# Patient Record
Sex: Female | Born: 1955
Health system: Southern US, Community
[De-identification: ages and names within clinical notes are randomized; demographics above are authoritative.]

## PROBLEM LIST (undated history)

## (undated) DIAGNOSIS — I10 Essential (primary) hypertension: Secondary | ICD-10-CM

## (undated) DIAGNOSIS — E785 Hyperlipidemia, unspecified: Secondary | ICD-10-CM

## (undated) DIAGNOSIS — K589 Irritable bowel syndrome without diarrhea: Secondary | ICD-10-CM

## (undated) DIAGNOSIS — G43109 Migraine with aura, not intractable, without status migrainosus: Secondary | ICD-10-CM

## (undated) DIAGNOSIS — M069 Rheumatoid arthritis, unspecified: Secondary | ICD-10-CM

## (undated) HISTORY — PX: OTHER SURGICAL HISTORY: SHX169

## (undated) HISTORY — DX: Essential (primary) hypertension: I10

## (undated) HISTORY — DX: Rheumatoid arthritis, unspecified: M06.9

## (undated) HISTORY — DX: Hyperlipidemia, unspecified: E78.5

## (undated) HISTORY — PX: TUBAL LIGATION: SHX77

## (undated) HISTORY — DX: Migraine with aura, not intractable, without status migrainosus: G43.109

## (undated) HISTORY — DX: Irritable bowel syndrome, unspecified: K58.9

---

## 2005-03-26 DIAGNOSIS — M069 Rheumatoid arthritis, unspecified: Secondary | ICD-10-CM

## 2005-03-26 HISTORY — DX: Rheumatoid arthritis, unspecified: M06.9

## 2012-06-30 ENCOUNTER — Encounter (HOSPITAL_COMMUNITY): Payer: Self-pay | Admitting: *Deleted

## 2012-06-30 ENCOUNTER — Emergency Department (HOSPITAL_COMMUNITY)
Admission: EM | Admit: 2012-06-30 | Discharge: 2012-06-30 | Disposition: A | Discharge status: Home / Self Care | Payer: No Typology Code available for payment source | Source: Home / Self Care | Attending: Family Medicine | Admitting: Family Medicine

## 2012-06-30 DIAGNOSIS — M069 Rheumatoid arthritis, unspecified: Secondary | ICD-10-CM

## 2012-06-30 LAB — COMPREHENSIVE METABOLIC PANEL
ALT: 11 U/L (ref 0–35)
AST: 15 U/L (ref 0–37)
Albumin: 3.5 g/dL (ref 3.5–5.2)
Alkaline Phosphatase: 91 U/L (ref 39–117)
Glucose, Bld: 86 mg/dL (ref 70–99)
Potassium: 4.2 mEq/L (ref 3.5–5.1)
Sodium: 139 mEq/L (ref 135–145)
Total Protein: 7.5 g/dL (ref 6.0–8.3)

## 2012-06-30 LAB — CBC
Hemoglobin: 13.1 g/dL (ref 12.0–15.0)
MCHC: 34.7 g/dL (ref 30.0–36.0)
Platelets: 313 10*3/uL (ref 150–400)

## 2012-06-30 MED ORDER — OMEPRAZOLE 20 MG PO CPDR: 20.0000 mg | DELAYED_RELEASE_CAPSULE | Freq: Every day | ORAL | Status: DC

## 2012-06-30 NOTE — ED Notes (Signed)
Patient present with swelling, pain, and stiffness In arms bilaterally, hands and left knee

## 2012-06-30 NOTE — ED Provider Notes (Signed)
History     CSN: [id number]  Arrival date & time 06/30/12  1730   First MD Initiated Contact with Patient 06/30/12 1814      No chief complaint on file.   (Consider location/radiation/quality/duration/timing/severity/associated sxs/prior treatment) HPI Pt says that she has been out of her medications for RA for last 4 or 5 months.  Pt says that she has a complicated medical history .  Pt has relocated here from [place].  Pt is having swelling and pain in hands and stiffness in knees and joints,  Worse joints involve her left knee.  No fever or chills.   Past Medical History  Diagnosis Date  . Arthritis     No past surgical history on file.  No family history on file.  History  Substance Use Topics  . Smoking status: Not on file  . Smokeless tobacco: Not on file  . Alcohol Use: Not on file    OB History   Grav Para Term Preterm Abortions TAB SAB Ect Mult Living                  Review of Systems Constitutional: Negative.  HENT: Negative.  Respiratory: Negative.  Cardiovascular: Negative.  Gastrointestinal: Negative.  Endocrine: Negative.  Genitourinary: Negative.  Musculoskeletal: Arthritis  Skin: Negative.  Allergic/Immunologic: Negative.  Neurological: Negative.  Hematological: Negative.  Psychiatric/Behavioral: Negative.  All other systems reviewed and are negative   Allergies  Review of patient's allergies indicates not on file.  Home Medications  No current outpatient prescriptions on file.  BP 122/75  Pulse 74  Temp(Src) 97.2 F (36.2 C) (Oral)  Resp 18  SpO2 99%  Physical Exam Nursing note and vitals reviewed.  Constitutional: She is oriented to person, place, and time. She appears well-developed and well-nourished. No distress.  HENT:  Head: Normocephalic and atraumatic.  Eyes: Conjunctivae and EOM are normal. Pupils are equal, round, and reactive to light.  Neck: Normal range of motion. Neck supple. No JVD present. No tracheal  deviation present. No thyromegaly present.  Cardiovascular: Normal rate, regular rhythm and normal heart sounds.  Pulmonary/Chest: Effort normal and breath sounds normal. No respiratory distress. She has no wheezes.  Abdominal: Soft. Bowel sounds are normal.  Musculoskeletal: Normal range of motion. She exhibits PIP and metatarsal edema and tenderness.  Lymphadenopathy:  She has no cervical adenopathy.  Neurological: She is alert and oriented to person, place, and time. She has normal reflexes.  Skin: Skin is warm and dry.  Psychiatric: She has a normal mood and affect. Her behavior is normal. Judgment and thought content normal.    ED Course  Procedures (including critical care time)  Labs Reviewed - No data to display No results found.  No diagnosis found.   MDM  IMPRESSION  Rheumatoid Arthritis  RECOMMENDATIONS / PLAN Continue celebrex 200 mg po daily Omeprazole 20 mg po daily for GI protection Referral to rheumatology for further evaluation and management DC advil and use tylenol instead  FOLLOW UP 1 month for recheck  Check labs today  The patient was given clear instructions to go to ER or return to medical center if symptoms don't improve, worsen or new problems develop.  The patient verbalized understanding.  The patient was told to call to get lab results if they haven't heard anything in the next week.            Cleora Fleet, MD 06/30/12 [phone]

## 2012-07-01 ENCOUNTER — Telehealth (HOSPITAL_COMMUNITY): Payer: Self-pay

## 2012-07-01 NOTE — ED Notes (Signed)
Called patient again. Lab results given

## 2012-07-01 NOTE — Progress Notes (Signed)
Quick Note:  Please inform patient that her labs came back OK except her sed rate was elevated which is consistent for inflammation that goes with the rheumatoid arthritis.   Rodney Langton, MD, CDE, FAAFP [facility] [place], [place]   ______

## 2012-07-02 NOTE — ED Notes (Signed)
Patient has an appt with rheumatology 5/20 @ 10:30 am

## 2012-10-14 ENCOUNTER — Telehealth: Payer: Self-pay | Admitting: Family Medicine

## 2012-10-14 DIAGNOSIS — Z0100 Encounter for examination of eyes and vision without abnormal findings: Secondary | ICD-10-CM

## 2012-10-14 NOTE — Telephone Encounter (Signed)
Pt. Needs an Opthalmology referral, pt has been having floaters in her eyes. Her arthritis doctor recommended that she goes to the Opthalmologist.

## 2012-12-11 ENCOUNTER — Ambulatory Visit: Payer: No Typology Code available for payment source | Attending: Internal Medicine

## 2012-12-24 ENCOUNTER — Ambulatory Visit: Payer: Self-pay

## 2013-01-01 ENCOUNTER — Ambulatory Visit: Payer: Self-pay

## 2013-01-09 ENCOUNTER — Ambulatory Visit: Payer: No Typology Code available for payment source | Attending: Internal Medicine | Admitting: Internal Medicine

## 2013-01-09 VITALS — BP 107/70 | HR 69 | Temp 97.9°F | Resp 16 | Wt 148.0 lb

## 2013-01-09 DIAGNOSIS — E785 Hyperlipidemia, unspecified: Secondary | ICD-10-CM | POA: Insufficient documentation

## 2013-01-09 DIAGNOSIS — Z23 Encounter for immunization: Secondary | ICD-10-CM | POA: Insufficient documentation

## 2013-01-09 DIAGNOSIS — M069 Rheumatoid arthritis, unspecified: Secondary | ICD-10-CM | POA: Insufficient documentation

## 2013-01-09 DIAGNOSIS — Z0000 Encounter for general adult medical examination without abnormal findings: Secondary | ICD-10-CM

## 2013-01-09 MED ORDER — ROSUVASTATIN CALCIUM 5 MG PO TABS: 5.0000 mg | ORAL_TABLET | Freq: Every day | ORAL | Status: DC

## 2013-01-09 NOTE — Progress Notes (Signed)
Patient ID: Alice Graham, female   DOB: Aug 12, 1955, 57 y.o.   MRN: [id number]   HPI 57 year old female with history of rheumatoid arthritis follows with a Rheumatologist at [facility] ( Dr Lanell Matar) here medication regarding her elevated cholesterol. Patient had lipid panel checked in July which showed total cholesterol 275 with elevated Lille of 188 and triglycerides of 180s. ( done at [facility]) Patient reports that her MAP program will cover for crestor. He reports having recurrent UTI after being on methotrexate.   Vital signs in last 24 hours:  Filed Vitals:   01/09/13 1221  BP: 107/70  Pulse: 69  Temp: 97.9 F (36.6 C)  TempSrc: Oral  Resp: 16  Weight: 148 lb (67.132 kg)  SpO2: 97%      Physical Exam:  General: Middle aged female in no acute distress HEENT: no pallor, no icterus, moist oral mucosa,  Heart: Normal  s1 &s2  Regular rate and rhythm, without murmurs, rubs, gallops. Lungs: Clear to auscultation bilaterally. Abdomen: Soft, nontender, nondistended, positive bowel sounds. Extremities: Warm, no edema Neuro: Alert, awake, oriented x3, nonfocal.   Lab Results:  Basic Metabolic Panel:    Component Value Date/Time   NA 139 06/30/2012 1830   K 4.2 06/30/2012 1830   CL 104 06/30/2012 1830   CO2 27 06/30/2012 1830   BUN 10 06/30/2012 1830   CREATININE 0.51 06/30/2012 1830   GLUCOSE 86 06/30/2012 1830   CALCIUM 9.2 06/30/2012 1830   CBC:    Component Value Date/Time   WBC 7.2 06/30/2012 1830   HGB 13.1 06/30/2012 1830   HCT 37.8 06/30/2012 1830   PLT 313 06/30/2012 1830   MCV 89.2 06/30/2012 1830    No results found for this or any previous visit (from the past 240 hour(s)).  Studies/Results: No results found.  Medications: Reviewed  Assessment/Plan: Hyperlipidemia We'll start her on low-dose Crestor 5 mg daily.  Rheumatoid arthritis Follows with Dr. Lanell Matar at [facility]  Refer to gyn for  PAP and mamogram  Flu vaccine to be given today  Follow up in 6 months with  repeat lipid panel  Harout Scheurich 01/09/2013, 12:49 PM

## 2013-01-09 NOTE — Progress Notes (Signed)
Pt is here to have her meds refilled and needing flu vaccination Voices no new concerns. Alert w/no signs of acute distress.

## 2013-03-23 ENCOUNTER — Encounter: Payer: No Typology Code available for payment source | Admitting: Obstetrics & Gynecology

## 2013-04-13 ENCOUNTER — Ambulatory Visit: Payer: Self-pay

## 2013-04-29 ENCOUNTER — Encounter: Payer: Self-pay | Admitting: Obstetrics & Gynecology

## 2013-05-06 ENCOUNTER — Encounter: Payer: Self-pay | Admitting: Obstetrics & Gynecology

## 2014-10-13 ENCOUNTER — Ambulatory Visit (INDEPENDENT_AMBULATORY_CARE_PROVIDER_SITE_OTHER): Payer: 59 | Admitting: Physician Assistant

## 2014-10-13 VITALS — BP 138/88 | HR 83 | Temp 98.2°F | Resp 16 | Ht 64.5 in | Wt 150.4 lb

## 2014-10-13 DIAGNOSIS — Z1211 Encounter for screening for malignant neoplasm of colon: Secondary | ICD-10-CM | POA: Diagnosis not present

## 2014-10-13 DIAGNOSIS — R030 Elevated blood-pressure reading, without diagnosis of hypertension: Secondary | ICD-10-CM | POA: Diagnosis not present

## 2014-10-13 DIAGNOSIS — Z124 Encounter for screening for malignant neoplasm of cervix: Secondary | ICD-10-CM | POA: Diagnosis not present

## 2014-10-13 DIAGNOSIS — E785 Hyperlipidemia, unspecified: Secondary | ICD-10-CM | POA: Diagnosis not present

## 2014-10-13 DIAGNOSIS — IMO0001 Reserved for inherently not codable concepts without codable children: Secondary | ICD-10-CM

## 2014-10-13 DIAGNOSIS — Z833 Family history of diabetes mellitus: Secondary | ICD-10-CM | POA: Diagnosis not present

## 2014-10-13 DIAGNOSIS — M069 Rheumatoid arthritis, unspecified: Secondary | ICD-10-CM | POA: Diagnosis not present

## 2014-10-13 DIAGNOSIS — Z1239 Encounter for other screening for malignant neoplasm of breast: Secondary | ICD-10-CM | POA: Diagnosis not present

## 2014-10-13 DIAGNOSIS — I10 Essential (primary) hypertension: Secondary | ICD-10-CM | POA: Insufficient documentation

## 2014-10-13 MED ORDER — ATORVASTATIN CALCIUM 20 MG PO TABS: 20.0000 mg | ORAL_TABLET | Freq: Every day | ORAL | Status: DC

## 2014-10-13 MED ORDER — LISINOPRIL 10 MG PO TABS: 10.0000 mg | ORAL_TABLET | Freq: Every day | ORAL | Status: DC

## 2014-10-13 NOTE — Progress Notes (Signed)
[facility] [address] [place] [address] [phone]  Date:  10/13/2014   Name:  Alice Graham   DOB:  06/25/1955   MRN:  [id number]  PCP:  Angelica Chessman, MD    Chief Complaint: referral; blood preesure; and OTHER   History of Present Illness:  This is a 59 y.o. female with PMH rheumatoid arthritis and HLD who is presenting to discuss high blood pressure and needing referral to rheumatology.   RA: Dx'd 2007. Seeing Dr. Lorenza Cambridge at [facility]. She is taking methotrexate and extra strength tylenol and controls her symptoms pretty well. RA has affected bilateral wrists and all fingers on bilateral hands. Insurance is requiring her to get a referral to rheumatology.  High blood pressure: checking at home for the past 2 months and usually 125-145/90-93. She is worried about her diastolic number. States she has a strong family history of DM, HTN and CAD. Her father died of a stroke at age 28. Her mother died of an MI at age 17. She has not had blood work since 2012 in [place]. In 2012 she moved her to be closer to family but has had problems with finances and has not seen a PCP since moving here.  HLD: was on lipitor 20 mg. Ran out 2 years ago and hasn't been on anything since.  Works as an Therapist, sports in [place].   Denies tobacco use  Last pap 2012. Mammogram 2012. Colonoscopy 2006. Father had history of colon cancer.  Review of Systems:  Review of Systems See HPI  Patient Active Problem List   Diagnosis Date Noted  . Elevated BP 10/13/2014  . Rheumatoid arthritis 01/09/2013  . Hyperlipidemia 01/09/2013    Prior to Admission medications   Medication Sig Start Date End Date Taking? Authorizing Provider  Acetaminophen (TYLENOL EXTRA STRENGTH PO) Take by mouth.   Yes Historical Provider, MD  aspirin 81 MG tablet Take 81 mg by mouth daily.   Yes Historical Provider, MD  celecoxib (CELEBREX) 200 MG capsule Take 200 mg by mouth 2  (two) times daily.   Yes Historical Provider, MD  folic acid (FOLVITE) 1 MG tablet Take 1 mg by mouth daily.   Yes Historical Provider, MD  methotrexate (RHEUMATREX) 2.5 MG tablet Take by mouth once a week. Caution:Chemotherapy. Protect from light.   Yes Historical Provider, MD  omeprazole (PRILOSEC) 20 MG capsule Take 1 capsule (20 mg total) by mouth daily. 06/30/12  Yes Clanford Marisa Hua, MD                  No Known Allergies  Past Surgical History  Procedure Laterality Date  . Childbirth    . Tubal ligation      History  Substance Use Topics  . Smoking status: Former Research scientist (life sciences)  . Smokeless tobacco: Not on file  . Alcohol Use: Yes     Comment: very rare use     Family History  Problem Relation Age of Onset  . Heart failure    . Stroke    . Diabetes    . COPD    . Diabetes Mother   . Heart disease Mother   . Cancer Father   . Heart disease Father   . Hyperlipidemia Father   . Hypertension Father   . Stroke Father     Medication list has been reviewed and updated.  Physical Examination:  Physical Exam  Constitutional: She is oriented to person, place, and time.  She appears well-developed and well-nourished. No distress.  Smells like tobacco  HENT:  Head: Normocephalic and atraumatic.  Right Ear: Hearing normal.  Left Ear: Hearing normal.  Nose: Nose normal.  Eyes: Conjunctivae and lids are normal. Right eye exhibits no discharge. Left eye exhibits no discharge. No scleral icterus.  Neck: Trachea normal. Carotid bruit is not present.  Cardiovascular: Normal rate, regular rhythm, normal heart sounds and normal pulses.   No murmur heard. Pulmonary/Chest: Effort normal and breath sounds normal. No respiratory distress. She has no wheezes. She has no rhonchi. She has no rales.  Musculoskeletal:       Right wrist: She exhibits decreased range of motion and swelling.       Left wrist: She exhibits decreased range of motion and swelling.       Right hand: She exhibits  decreased range of motion.       Left hand: She exhibits decreased range of motion.  Unable to make fist with bilateral hands.  Neurological: She is alert and oriented to person, place, and time.  Skin: Skin is warm, dry and intact. No lesion and no rash noted.  Psychiatric: She has a normal mood and affect. Her speech is normal and behavior is normal. Thought content normal.   BP 138/88 mmHg  Pulse 83  Temp(Src) 98.2 F (36.8 C) (Oral)  Resp 16  Ht 5' 4.5" (1.638 m)  Wt 150 lb 6.4 oz (68.221 kg)  BMI 25.43 kg/m2  SpO2 97%  Assessment and Plan:  1. Elevated BP BP not very elevated but pt worried because of her family history of DM, CAD, and HTN. She would like to start something for blood pressure. Lisinopril 10 mg prescribed. She will continue to monitor BP at home and follow up in 3 months. - lisinopril (PRINIVIL,ZESTRIL) 10 MG tablet; Take 1 tablet (10 mg total) by mouth daily.  Dispense: 90 tablet; Refill: 1  2. Rheumatoid arthritis Referral placed. - Ambulatory referral to Rheumatology - CBC; Future  3. Hyperlipidemia Restart lipitor. She will return for fasting blood work. Follow up in 3 months. - atorvastatin (LIPITOR) 20 MG tablet; Take 1 tablet (20 mg total) by mouth daily.  Dispense: 90 tablet; Refill: 1 - Lipid panel; Future  4. Colon cancer screening - Ambulatory referral to Gastroenterology  5. Breast cancer screening - MM Digital Screening; Future  6. Cervical cancer screening Due for pap smear. At follow up in 3 months will do pap smear.  7. Family history of diabetes mellitus - Comprehensive metabolic panel; Future - Hemoglobin A1c; Future   Benjaman Pott. Drenda Freeze, MHS [facility]  10/13/2014

## 2014-10-13 NOTE — Patient Instructions (Signed)
Start taking lisinopril daily for blood pressure. Keep monitoring BP at home. Start taking lipitor daily. Return for fasting blood work. I will call you with results. You will get phone call about referrals to rheumatology, mammogram and colonoscopy. Return to see me in 3 months for follow up and repeat blood work - we will do pap smear at that time.

## 2014-10-14 ENCOUNTER — Other Ambulatory Visit: Payer: Self-pay

## 2014-10-14 ENCOUNTER — Other Ambulatory Visit (INDEPENDENT_AMBULATORY_CARE_PROVIDER_SITE_OTHER): Payer: 59

## 2014-10-14 DIAGNOSIS — M069 Rheumatoid arthritis, unspecified: Secondary | ICD-10-CM | POA: Diagnosis not present

## 2014-10-14 DIAGNOSIS — E785 Hyperlipidemia, unspecified: Secondary | ICD-10-CM

## 2014-10-14 DIAGNOSIS — Z1231 Encounter for screening mammogram for malignant neoplasm of breast: Secondary | ICD-10-CM

## 2014-10-14 DIAGNOSIS — Z833 Family history of diabetes mellitus: Secondary | ICD-10-CM

## 2014-10-15 ENCOUNTER — Other Ambulatory Visit: Payer: Self-pay

## 2014-10-15 DIAGNOSIS — Z1231 Encounter for screening mammogram for malignant neoplasm of breast: Secondary | ICD-10-CM

## 2014-10-15 LAB — CBC
HEMATOCRIT: 38.6 % (ref 34.0–46.6)
Hemoglobin: 13 g/dL (ref 11.1–15.9)
MCH: 29.3 pg (ref 26.6–33.0)
MCHC: 33.7 g/dL (ref 31.5–35.7)
MCV: 87 fL (ref 79–97)
Platelets: 370 10*3/uL (ref 150–379)
RBC: 4.43 x10E6/uL (ref 3.77–5.28)
RDW: 14.8 % (ref 12.3–15.4)
WBC: 7 10*3/uL (ref 3.4–10.8)

## 2014-10-15 LAB — COMPREHENSIVE METABOLIC PANEL
ALK PHOS: 84 IU/L (ref 39–117)
ALT: 11 IU/L (ref 0–32)
AST: 15 IU/L (ref 0–40)
Albumin/Globulin Ratio: 1.4 (ref 1.1–2.5)
Albumin: 4.1 g/dL (ref 3.6–4.8)
BUN / CREAT RATIO: 16 (ref 11–26)
BUN: 8 mg/dL (ref 8–27)
Bilirubin Total: 0.2 mg/dL (ref 0.0–1.2)
CALCIUM: 9.1 mg/dL (ref 8.7–10.3)
CHLORIDE: 98 mmol/L (ref 97–108)
CO2: 23 mmol/L (ref 18–29)
CREATININE: 0.49 mg/dL — AB (ref 0.57–1.00)
GFR calc Af Amer: 122 mL/min/{1.73_m2} (ref >59)
GFR calc non Af Amer: 106 mL/min/{1.73_m2} (ref >59)
Globulin, Total: 2.9 g/dL (ref 1.5–4.5)
Glucose: 100 mg/dL — ABNORMAL HIGH (ref 65–99)
Potassium: 4.7 mmol/L (ref 3.5–5.2)
Sodium: 138 mmol/L (ref 134–144)
Total Protein: 7 g/dL (ref 6.0–8.5)

## 2014-10-15 LAB — LIPID PANEL
Chol/HDL Ratio: 5.1 ratio units — ABNORMAL HIGH (ref 0.0–4.4)
Cholesterol, Total: 241 mg/dL — ABNORMAL HIGH (ref 100–199)
HDL: 47 mg/dL (ref >39)
LDL Calculated: 174 mg/dL — ABNORMAL HIGH (ref 0–99)
TRIGLYCERIDES: 100 mg/dL (ref 0–149)
VLDL Cholesterol Cal: 20 mg/dL (ref 5–40)

## 2014-10-15 LAB — HEMOGLOBIN A1C
ESTIMATED AVERAGE GLUCOSE: 114 mg/dL
HEMOGLOBIN A1C: 5.6 % (ref 4.8–5.6)

## 2014-11-03 ENCOUNTER — Ambulatory Visit
Admission: RE | Admit: 2014-11-03 | Discharge: 2014-11-03 | Disposition: A | Discharge status: Home / Self Care | Payer: 59 | Source: Ambulatory Visit | Attending: Physician Assistant | Admitting: Physician Assistant

## 2014-11-03 DIAGNOSIS — Z1231 Encounter for screening mammogram for malignant neoplasm of breast: Secondary | ICD-10-CM

## 2014-11-12 ENCOUNTER — Ambulatory Visit (INDEPENDENT_AMBULATORY_CARE_PROVIDER_SITE_OTHER): Payer: 59 | Admitting: Family Medicine

## 2014-11-12 VITALS — BP 116/80 | HR 88 | Temp 98.2°F | Resp 16 | Ht 64.5 in | Wt 149.8 lb

## 2014-11-12 DIAGNOSIS — Z124 Encounter for screening for malignant neoplasm of cervix: Secondary | ICD-10-CM

## 2014-11-12 DIAGNOSIS — Z9225 Personal history of immunosupression therapy: Secondary | ICD-10-CM

## 2014-11-12 NOTE — Patient Instructions (Signed)
I will be in touch with your pap results when they come in.

## 2014-11-12 NOTE — Progress Notes (Signed)
[facility] [address] [place] [address] [phone]  Date:  11/12/2014   Name:  Alice Graham   DOB:  09-22-1955   MRN:  [id number]  PCP:  Alice Chessman, MD    Chief Complaint: Gynecologic Exam   History of Present Illness:  Alice Graham is a 59 y.o. very pleasant female patient who presents with the following:  Her last pap was in 2012.   She did have HPV on her last pap- this was done in [place].  She was told that it was "low risk," she did not need any follow-up apparently.    No vaginal bleeding.  Menopausal in her late 78s.   Dx with RA about 10 years ago- on methotrexate but not prednisone   She also notes that she has gotten pretty ill over the winter for the last couple of years.  She always gets her flu shot, but wonders if she needs a pneumonia shot as well.  She doe qualify as she is on immunosuppresive drugs, but she just took her weekly dose this am  Patient Active Problem List   Diagnosis Date Noted  . Elevated BP 10/13/2014  . Rheumatoid arthritis 01/09/2013  . Hyperlipidemia 01/09/2013    Past Medical History  Diagnosis Date  . Arthritis   . Hyperlipidemia   . Hypertension     Past Surgical History  Procedure Laterality Date  . Childbirth    . Tubal ligation      Social History  Substance Use Topics  . Smoking status: Former Research scientist (life sciences)  . Smokeless tobacco: None  . Alcohol Use: Yes     Comment: very rare use     Family History  Problem Relation Age of Onset  . Heart failure    . Stroke    . Diabetes    . COPD    . Diabetes Mother   . Heart disease Mother 42    MI that resulted in death  . Cancer Father 20    colon cancer  . Heart disease Father   . Hyperlipidemia Father   . Hypertension Father   . Stroke Father     No Known Allergies  Medication list has been reviewed and updated.  Current Outpatient Prescriptions on File Prior to Visit  Medication Sig Dispense Refill  . Acetaminophen (TYLENOL  EXTRA STRENGTH PO) Take by mouth.    Marland Kitchen aspirin 81 MG tablet Take 81 mg by mouth daily.    Marland Kitchen atorvastatin (LIPITOR) 20 MG tablet Take 1 tablet (20 mg total) by mouth daily. 90 tablet 1  . celecoxib (CELEBREX) 200 MG capsule Take 200 mg by mouth 2 (two) times daily.    Mariane Baumgarten Calcium (STOOL SOFTENER PO) Take by mouth.    . folic acid (FOLVITE) 1 MG tablet Take 1 mg by mouth daily.    . methotrexate (RHEUMATREX) 2.5 MG tablet Take by mouth once a week. Caution:Chemotherapy. Protect from light.    Marland Kitchen omeprazole (PRILOSEC) 20 MG capsule Take 1 capsule (20 mg total) by mouth daily. 30 capsule 4  . lisinopril (PRINIVIL,ZESTRIL) 10 MG tablet Take 1 tablet (10 mg total) by mouth daily. (Patient not taking: Reported on 11/12/2014) 90 tablet 1  . rosuvastatin (CRESTOR) 5 MG tablet Take 1 tablet (5 mg total) by mouth daily. (Patient not taking: Reported on 10/13/2014) 90 tablet 3   No current facility-administered medications on file prior to visit.    Review of Systems:  As per HPI-  otherwise negative.   Physical Examination: Filed Vitals:   11/12/14 1343  BP: 116/80  Pulse: 88  Temp: 98.2 F (36.8 C)  Resp: 16   Filed Vitals:   11/12/14 1343  Height: 5' 4.5" (1.638 m)  Weight: 149 lb 12.8 oz (67.949 kg)   Body mass index is 25.33 kg/(m^2). Ideal Body Weight: Weight in (lb) to have BMI = 25: 147.6  GEN: WDWN, NAD, Non-toxic, A & O x 3 HEENT: Atraumatic, Normocephalic. Neck supple. No masses, No LAD.  Bilateral TM wnl, oropharynx normal.  PEERL,EOMI.   Ears and Nose: No external deformity. CV: RRR, No M/G/R. No JVD. No thrill. No extra heart sounds. PULM: CTA B, no wheezes, crackles, rhonchi. No retractions. No resp. distress. No accessory muscle use. ABD: S, NT, ND EXTR: No c/c/e NEURO Normal gait.  PSYCH: Normally interactive. Conversant. Not depressed or anxious appearing.  Calm demeanor.  Pelvic: normal, no vaginal lesions or discharge. Uterus normal, no CMT, no adnexal  tendereness or masses She does have vitiligo of her vulva   Assessment and Plan: Screening for cervical cancer - Plan: Pap IG and HPV (high risk) DNA detection  Personal history of immunosupression therapy  She is interested in getting pneumovax and would qualify as she is on immunosuppressive drugs.  However she just took her methotrexate today and would like to temporarily separate her drug and the prevnar, which is a good idea Advised that she can get the flu shot and pneumonia shot together this fall at drug store - she may do this Await her pap- she did have some sort of HPV a few years but states it was "low risk," did HPV testing today as well   Signed Lamar Blinks, MD

## 2014-11-16 LAB — PAP IG AND HPV HIGH-RISK: HPV DNA HIGH RISK: NOT DETECTED

## 2014-11-18 ENCOUNTER — Encounter: Payer: Self-pay | Admitting: Family Medicine

## 2015-04-25 ENCOUNTER — Other Ambulatory Visit: Payer: Self-pay | Admitting: Physician Assistant

## 2015-08-04 ENCOUNTER — Other Ambulatory Visit: Payer: Self-pay | Admitting: Physician Assistant

## 2015-11-10 ENCOUNTER — Other Ambulatory Visit: Payer: Self-pay | Admitting: Physician Assistant

## 2015-11-14 ENCOUNTER — Other Ambulatory Visit: Payer: Self-pay

## 2015-11-14 MED ORDER — ATORVASTATIN CALCIUM 20 MG PO TABS: 20.0000 mg | ORAL_TABLET | Freq: Every day | ORAL | 0 refills | Status: DC

## 2015-12-09 ENCOUNTER — Other Ambulatory Visit: Payer: Self-pay | Admitting: Physician Assistant

## 2015-12-20 ENCOUNTER — Other Ambulatory Visit: Payer: Self-pay

## 2015-12-20 DIAGNOSIS — R030 Elevated blood-pressure reading, without diagnosis of hypertension: Principal | ICD-10-CM

## 2015-12-20 DIAGNOSIS — IMO0001 Reserved for inherently not codable concepts without codable children: Secondary | ICD-10-CM

## 2016-01-19 ENCOUNTER — Ambulatory Visit (INDEPENDENT_AMBULATORY_CARE_PROVIDER_SITE_OTHER): Payer: BLUE CROSS/BLUE SHIELD | Admitting: Family Medicine

## 2016-01-19 VITALS — BP 120/72 | HR 90 | Temp 98.1°F | Resp 18 | Ht 64.5 in | Wt 153.0 lb

## 2016-01-19 DIAGNOSIS — D229 Melanocytic nevi, unspecified: Secondary | ICD-10-CM

## 2016-01-19 DIAGNOSIS — Z23 Encounter for immunization: Secondary | ICD-10-CM

## 2016-01-19 DIAGNOSIS — M0520 Rheumatoid vasculitis with rheumatoid arthritis of unspecified site: Secondary | ICD-10-CM

## 2016-01-19 DIAGNOSIS — E785 Hyperlipidemia, unspecified: Secondary | ICD-10-CM | POA: Diagnosis not present

## 2016-01-19 DIAGNOSIS — J301 Allergic rhinitis due to pollen: Secondary | ICD-10-CM | POA: Diagnosis not present

## 2016-01-19 DIAGNOSIS — I00 Rheumatic fever without heart involvement: Secondary | ICD-10-CM

## 2016-01-19 LAB — COMPREHENSIVE METABOLIC PANEL
ALT: 10 U/L (ref 6–29)
AST: 14 U/L (ref 10–35)
Albumin: 4.1 g/dL (ref 3.6–5.1)
Alkaline Phosphatase: 75 U/L (ref 33–130)
BUN: 8 mg/dL (ref 7–25)
CHLORIDE: 102 mmol/L (ref 98–110)
CO2: 24 mmol/L (ref 20–31)
CREATININE: 0.58 mg/dL (ref 0.50–0.99)
Calcium: 9.1 mg/dL (ref 8.6–10.4)
GLUCOSE: 83 mg/dL (ref 65–99)
POTASSIUM: 4.5 mmol/L (ref 3.5–5.3)
SODIUM: 137 mmol/L (ref 135–146)
TOTAL PROTEIN: 7 g/dL (ref 6.1–8.1)
Total Bilirubin: 0.3 mg/dL (ref 0.2–1.2)

## 2016-01-19 LAB — LIPID PANEL
CHOL/HDL RATIO: 6.7 ratio — AB (ref <=5.0)
CHOLESTEROL: 269 mg/dL — AB (ref 125–200)
HDL: 40 mg/dL — ABNORMAL LOW (ref >=46)
LDL CALC: 197 mg/dL — AB (ref <130)
Triglycerides: 160 mg/dL — ABNORMAL HIGH (ref <150)
VLDL: 32 mg/dL — AB (ref <30)

## 2016-01-19 LAB — CBC WITH DIFFERENTIAL/PLATELET
BASOS ABS: 0 {cells}/uL (ref 0–200)
Basophils Relative: 0 %
EOS ABS: 144 {cells}/uL (ref 15–500)
EOS PCT: 2 %
HCT: 40.6 % (ref 35.0–45.0)
Hemoglobin: 13.6 g/dL (ref 11.7–15.5)
LYMPHS PCT: 30 %
Lymphs Abs: 2160 cells/uL (ref 850–3900)
MCH: 30 pg (ref 27.0–33.0)
MCHC: 33.5 g/dL (ref 32.0–36.0)
MCV: 89.6 fL (ref 80.0–100.0)
MONOS PCT: 10 %
MPV: 9.2 fL (ref 7.5–12.5)
Monocytes Absolute: 720 cells/uL (ref 200–950)
NEUTROS ABS: 4176 {cells}/uL (ref 1500–7800)
Neutrophils Relative %: 58 %
PLATELETS: 367 10*3/uL (ref 140–400)
RBC: 4.53 MIL/uL (ref 3.80–5.10)
RDW: 14.8 % (ref 11.0–15.0)
WBC: 7.2 10*3/uL (ref 3.8–10.8)

## 2016-01-19 MED ORDER — ATORVASTATIN CALCIUM 20 MG PO TABS: 20.0000 mg | ORAL_TABLET | Freq: Every day | ORAL | 2 refills | Status: DC

## 2016-01-19 MED ORDER — ALBUTEROL SULFATE HFA 108 (90 BASE) MCG/ACT IN AERS: 2.0000 | INHALATION_SPRAY | Freq: Four times a day (QID) | RESPIRATORY_TRACT | 0 refills | Status: DC | PRN

## 2016-01-19 MED ORDER — FLUTICASONE PROPIONATE 50 MCG/ACT NA SUSP: 2.0000 | Freq: Every day | NASAL | 6 refills | Status: DC

## 2016-01-19 NOTE — Patient Instructions (Addendum)
It was good to you today!  There are actually 2 pneumonia vaccines we use now.  You received the first one today. You should come back in 8-12 weeks for the second pneumonia shot.  We're checking your labs today.  I have sent in both the Flonase nasal spray and the albuterol inhaler for you today.  We'll also refer you to dermatology for the mole on your stomach.    IF you received an x-ray today, you will receive an invoice from [facility]. Please contact [facility] at [phone] with questions or concerns regarding your invoice.   IF you received labwork today, you will receive an invoice from [employer]. Please contact [facility] at [phone] with questions or concerns regarding your invoice.   Our billing staff will not be able to assist you with questions regarding bills from these companies.  You will be contacted with the lab results as soon as they are available. The fastest way to get your results is to activate your My Chart account. Instructions are located on the last page of this paperwork. If you have not heard from [place] regarding the results in 2 weeks, please contact this office.

## 2016-01-19 NOTE — Progress Notes (Signed)
Alice Graham is a 60 y.o. female who [facility] today for Lipitor refill:  1.   HLD:  Last lipid panel listed below.    Currently is/not on Statin.  Denies any myalgias, icterus, jaundice.  Tolerating medications well.   Lab Results  Component Value Date   CHOL 241 (H) 10/14/2014   Lab Results  Component Value Date   HDL 47 10/14/2014   Lab Results  Component Value Date   LDLCALC 174 (H) 10/14/2014   Lab Results  Component Value Date   TRIG 100 10/14/2014   Lab Results  Component Value Date   CHOLHDL 5.1 (H) 10/14/2014   2.  Rheumatoid arthritis:  Longstanding problem for patient.  Followed by [facility].  On methotrexate.  Desires pneumococcal vaccine today.  No fevers or chills.  Pain controlled.    3.  Allergic rhinitis:  Long-standing problem for patient. Evidently she also at times has issues with wheezing and shortness of breath. None currently. She is a long-time smoker but quit several years ago. She has been on albuterol in the past which does help with the shortness of breath.  ROS as above.    PMH reviewed. Patient is a nonsmoker.   Past Medical History:  Diagnosis Date  . Arthritis   . Hyperlipidemia   . Hypertension    Past Surgical History:  Procedure Laterality Date  . childbirth    . TUBAL LIGATION      Medications reviewed. Current Outpatient Prescriptions  Medication Sig Dispense Refill  . Acetaminophen (TYLENOL EXTRA STRENGTH PO) Take by mouth.    Marland Kitchen aspirin 81 MG tablet Take 81 mg by mouth daily.    Marland Kitchen atorvastatin (LIPITOR) 20 MG tablet Take 1 tablet (20 mg total) by mouth daily. 30 tablet 0  . celecoxib (CELEBREX) 200 MG capsule Take 200 mg by mouth 2 (two) times daily.    Mariane Baumgarten Calcium (STOOL SOFTENER PO) Take by mouth.    . folic acid (FOLVITE) 1 MG tablet Take 1 mg by mouth daily.    . methotrexate (RHEUMATREX) 2.5 MG tablet Take by mouth once a week. Caution:Chemotherapy. Protect from light.      Marland Kitchen omeprazole (PRILOSEC) 20 MG capsule Take 1 capsule (20 mg total) by mouth daily. 30 capsule 4  . lisinopril (PRINIVIL,ZESTRIL) 10 MG tablet Take 1 tablet (10 mg total) by mouth daily. (Patient not taking: Reported on 01/19/2016) 90 tablet 1  . rosuvastatin (CRESTOR) 5 MG tablet Take 1 tablet (5 mg total) by mouth daily. (Patient not taking: Reported on 01/19/2016) 90 tablet 3   No current facility-administered medications for this visit.      Physical Exam:  BP 120/72   Pulse 90   Temp 98.1 F (36.7 C) (Oral)   Resp 18   Ht 5' 4.5" (1.638 m)   Wt 153 lb (69.4 kg)   SpO2 99%   BMI 25.86 kg/m  Gen:  Alert, cooperative patient who appears stated age in no acute distress.  Vital signs reviewed. HEENT: EOMI,  MMM Pulm:  Clear to auscultation bilaterally  Cardiac:  Regular rate and rhythm  Abd:  Soft/nondistended/nontender.   Exts: No LE edema MSK:  Stigmata of rheumatoid arthritis noted with bilateral swelling hands and wrist.  Restricted range of motion in fingers consistent with rheumatoid arthritis Skin:  2 cm in diameter raised, pigmented nodule on right abdomen in mid-clavicular line, nontender without irritation/inflammation  Assessment and Plan:  1.  HLD: - refill for lipitor today - checking lipids today as well as CMET  2.  Allergic rhinitis: - starting flonase.  Also refill for albuterol.  - she would likely benefit from eval for COPD as former smoker.   3.  Vaccines:  - prevnar today due to RA and long-term methotrexate usage.    4.  BP: - controlled and has been in past.  I removed ACE-I from her medication list, concern this may cause fall if refilled.  Not currently taking this.    5.  RA: - controlled.  FU with [facility].   6.  Mole on abdomen" - consistent with seb keratosis.  However, patient states this started initially as small brown area.  Will send to dermatology for further evaluation and likely biopsy.

## 2016-05-01 ENCOUNTER — Other Ambulatory Visit: Payer: Self-pay | Admitting: Family Medicine

## 2016-05-01 ENCOUNTER — Ambulatory Visit (INDEPENDENT_AMBULATORY_CARE_PROVIDER_SITE_OTHER): Payer: BLUE CROSS/BLUE SHIELD | Admitting: Family Medicine

## 2016-05-01 VITALS — BP 124/84 | HR 88 | Temp 97.7°F | Resp 16 | Ht 64.5 in | Wt 156.6 lb

## 2016-05-01 DIAGNOSIS — Z23 Encounter for immunization: Secondary | ICD-10-CM

## 2016-05-01 DIAGNOSIS — Z5181 Encounter for therapeutic drug level monitoring: Secondary | ICD-10-CM | POA: Diagnosis not present

## 2016-05-01 DIAGNOSIS — I00 Rheumatic fever without heart involvement: Secondary | ICD-10-CM

## 2016-05-01 DIAGNOSIS — E785 Hyperlipidemia, unspecified: Secondary | ICD-10-CM

## 2016-05-01 DIAGNOSIS — M0520 Rheumatoid vasculitis with rheumatoid arthritis of unspecified site: Secondary | ICD-10-CM

## 2016-05-01 NOTE — Patient Instructions (Addendum)
Make an appointment with your eye doctor for your annual exam since you are on methotrexate and please ask him to fax me a copy of the note.   IF you received an x-ray today, you will receive an invoice from [facility]. Please contact [facility] at [phone] with questions or concerns regarding your invoice.   IF you received labwork today, you will receive an invoice from [place]. Please contact [facility] at [phone] with questions or concerns regarding your invoice.   Our billing staff will not be able to assist you with questions regarding bills from these companies.  You will be contacted with the lab results as soon as they are available. The fastest way to get your results is to activate your My Chart account. Instructions are located on the last page of this paperwork. If you have not heard from [place] regarding the results in 2 weeks, please contact this office.    Make sure you are taking a daily calcium/vitamin D supplement - and twice a day would be great. Try to find a chewable calcium CITRATE supplement with 400 to 600 mg of calcium in it and as much vitamin D as you can.  Take this at least once a day, and not with other calcium sources for maximum absorption. Look for an off-brand that is like "Citracal" or "Caltrate" which you will be able to absorb better that the calcium CARBONATE products while you are on medicines for acid reflux.    Bone Health Introduction Bones protect organs, store calcium, and anchor muscles. Good health habits, such as eating nutritious foods and exercising regularly, are important for maintaining healthy bones. They can also help to prevent a condition that causes bones to lose density and become weak and brittle (osteoporosis). Why is bone mass important? Bone mass refers to the amount of bone tissue that you have. The higher your bone mass, the stronger your bones. An important step toward having healthy bones throughout life  is to have strong and dense bones during childhood. A young adult who has a high bone mass is more likely to have a high bone mass later in life. Bone mass at its greatest it is called peak bone mass. A large decline in bone mass occurs in older adults. In women, it occurs about the time of menopause. During this time, it is important to practice good health habits, because if more bone is lost than what is replaced, the bones will become less healthy and more likely to break (fracture). If you find that you have a low bone mass, you may be able to prevent osteoporosis or further bone loss by changing your diet and lifestyle. How can I find out if my bone mass is low? Bone mass can be measured with an X-ray test that is called a bone mineral density (BMD) test. This test is recommended for all women who are age 48 or older. It may also be recommended for men who are age 27 or older, or for people who are more likely to develop osteoporosis due to:  Having bones that break easily.  Having a long-term disease that weakens bones, such as kidney disease or rheumatoid arthritis.  Having menopause earlier than normal.  Taking medicine that weakens bones, such as steroids, thyroid hormones, or hormone treatment for breast cancer or prostate cancer.  Smoking.  Drinking three or more alcoholic drinks each day. What are the nutritional recommendations for healthy bones? To have healthy bones, you need to get  enough of the right minerals and vitamins. Most nutrition experts recommend getting these nutrients from the foods that you eat. Nutritional recommendations vary from person to person. Ask your health care provider what is healthy for you. Here are some general guidelines. Calcium Recommendations  Calcium is the most important (essential) mineral for bone health. Most people can get enough calcium from their diet, but supplements may be recommended for people who are at risk for osteoporosis. Good  sources of calcium include:  Dairy products, such as low-fat or nonfat milk, cheese, and yogurt.  Dark green leafy vegetables, such as bok choy and broccoli.  Calcium-fortified foods, such as orange juice, cereal, bread, soy beverages, and tofu products.  Nuts, such as almonds. Follow these recommended amounts for daily calcium intake:  Children, age 31?3: 700 mg.  Children, age 60?8: 1,000 mg.  Children, age 62?13: 1,300 mg.  Teens, age 66?18: 1,300 mg.  Adults, age 68?50: 1,000 mg.  Adults, age 76?70:  Men: 1,000 mg.  Women: 1,200 mg.  Adults, age 3 or older: 1,200 mg.  Pregnant and breastfeeding females:  Teens: 1,300 mg.  Adults: 1,000 mg. Vitamin D Recommendations  Vitamin D is the most essential vitamin for bone health. It helps the body to absorb calcium. Sunlight stimulates the skin to make vitamin D, so be sure to get enough sunlight. If you live in a cold climate or you do not get outside often, your health care provider may recommend that you take vitamin D supplements. Good sources of vitamin D in your diet include:  Egg yolks.  Saltwater fish.  Milk and cereal fortified with vitamin D. Follow these recommended amounts for daily vitamin D intake:  Children and teens, age 62?18: 15 international units.  Adults, age 63 or younger: 400-800 international units.  Adults, age 29 or older: 800-1,000 international units. Other Nutrients  Other nutrients for bone health include:  Phosphorus. This mineral is found in meat, poultry, dairy foods, nuts, and legumes. The recommended daily intake for adult men and adult women is 700 mg.  Magnesium. This mineral is found in seeds, nuts, dark green vegetables, and legumes. The recommended daily intake for adult men is 400?420 mg. For adult women, it is 310?320 mg.  Vitamin K. This vitamin is found in green leafy vegetables. The recommended daily intake is 120 mg for adult men and 90 mg for adult women. What type of  physical activity is best for building and maintaining healthy bones? Weight-bearing and strength-building activities are important for building and maintaining peak bone mass. Weight-bearing activities cause muscles and bones to work against gravity. Strength-building activities increases muscle strength that supports bones. Weight-bearing and muscle-building activities include:  Walking and hiking.  Jogging and running.  Dancing.  Gym exercises.  Lifting weights.  Tennis and racquetball.  Climbing stairs.  Aerobics. Adults should get at least 30 minutes of moderate physical activity on most days. Children should get at least 60 minutes of moderate physical activity on most days. Ask your health care provide what type of exercise is best for you. Where can I find more information? For more information, check out the following websites:  [place]: YardHomes.se  [employer] of Health: http://www.niams.AnonymousEar.fr.asp This information is not intended to replace advice given to you by your health care provider. Make sure you discuss any questions you have with your health care provider. Document Released: 06/02/2003 Document Revised: 09/30/2015 Document Reviewed: 03/17/2014  2017 Elsevier

## 2016-05-01 NOTE — Progress Notes (Signed)
Subjective:  By signing my name below, I, [name], attest that this documentation has been prepared under the direction and in the presence of Delman Cheadle, MD Electronically Signed: Ladene Artist, ED Scribe 05/01/2016 at 9:39 AM.   Patient ID: Alice Graham, female    DOB: Jun 21, 1955, 60 y.o.   MRN: [id number]  Chief Complaint  Patient presents with  . Follow-up    pt fasting for cholesterol recheck   HPI Alice Graham is a 61 y.o. female, with a h/o hyperlipidemia, who presents to Primary Care at [facility] for a follow-up. Pt was seen 4 months ago by Dr. Mingo Amber. She had been off of Lipitor for 6 weeks-2 months at that time. She restarted Lipitor without complications. Pt also received the Prevnar and is due for the Pneumovax today.   Establish Care Pt has been on Methotrexate since 2007 for RA which she states has been stable. Pt has been taking Celebrex for a while as well; written twice daily but states that she takes it once a day. She typically sees an eye doctor once a year but states that she hasn't seen her eye doctor for 2 years.   Bone Density Pt had a Dexa scan done in May 2014 at [facility] that showed osteoporosis. She denies indigestion, heartburn or a h/o heart disease. She is not currently taking calcium or vitamin D.   GI/GU Pt reports a h/o intermittent chronic constipation that she attributes to IBS. She states that constipation usually resolved with adding a laxative and oatmeal to her diet and increasing her water intake. She admits that she is due for another colonoscopy. No h/o hysterectomy or oophorectomy.    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   Depression screen [facility] 2/9 05/01/2016 01/19/2016 11/12/2014 10/13/2014  Decreased Interest 0 0 0 0  Down, Depressed, Hopeless 0 0 0  0  PHQ - 2 Score 0 0 0 0     Past Medical History:  Diagnosis Date  . Arthritis   . Hyperlipidemia   . Hypertension    Current Outpatient Prescriptions on File Prior to Visit  Medication Sig Dispense Refill  . Acetaminophen (TYLENOL EXTRA STRENGTH PO) Take by mouth.    Marland Kitchen albuterol (PROVENTIL HFA;VENTOLIN HFA) 108 (90 Base) MCG/ACT inhaler Inhale 2 puffs into the lungs every 6 (six) hours as needed for wheezing or shortness of breath. 1 Inhaler 0  . aspirin 81 MG tablet Take 81 mg by mouth daily.    Marland Kitchen atorvastatin (LIPITOR) 20 MG tablet Take 1 tablet (20 mg total) by mouth daily. 90 tablet 2  . celecoxib (CELEBREX) 200 MG capsule Take 200 mg by mouth 2 (two) times daily.    Mariane Baumgarten Calcium (STOOL SOFTENER PO) Take by mouth.    . fluticasone (FLONASE) 50 MCG/ACT nasal spray Place 2 sprays into both nostrils daily. 16 g 6  . folic acid (FOLVITE) 1 MG tablet Take 1 mg by mouth daily.    . methotrexate (RHEUMATREX) 2.5 MG tablet Take by mouth once a week. Caution:Chemotherapy. Protect from light.    Marland Kitchen omeprazole (PRILOSEC) 20 MG capsule Take 1 capsule (20 mg total) by mouth daily. 30 capsule 4   No current facility-administered medications on file prior to visit.    No Known Allergies  Past Surgical  History:  Procedure Laterality Date  . childbirth    . TUBAL LIGATION     Family History  Problem Relation Age of Onset  . Heart failure    . Stroke    . Diabetes    . COPD    . Diabetes Mother   . Heart disease Mother 43    MI that resulted in death  . Cancer Father 26    colon cancer  . Heart disease Father   . Hyperlipidemia Father   . Hypertension Father   . Stroke Father    Social History   Social History  . Marital status: Single    Spouse name: N/A  . Number of children: 2  . Years of education: college    Occupational History  . Nurse [employer]    Social History Main Topics  . Smoking status: Former Research scientist (life sciences)  . Smokeless  tobacco: Never Used  . Alcohol use Yes     Comment: very rare use   . Drug use: No  . Sexual activity: Not Asked   Other Topics Concern  . None   Social History Narrative  . None    Review of Systems  Constitutional: Negative for activity change, appetite change, chills and fever.  Gastrointestinal: Positive for constipation (intermittent).  Musculoskeletal: Positive for arthralgias and joint swelling. Negative for gait problem and myalgias.  Psychiatric/Behavioral: Negative for dysphoric mood. The patient is not nervous/anxious.   see hpi    Objective:   Physical Exam  Constitutional: She is oriented to person, place, and time. She appears well-developed and well-nourished. No distress.  HENT:  Head: Normocephalic and atraumatic.  Eyes: Conjunctivae and EOM are normal.  Neck: Neck supple. No tracheal deviation present.  Cardiovascular: Normal rate.   Pulmonary/Chest: Effort normal. No respiratory distress.  Musculoskeletal: Normal range of motion.  Neurological: She is alert and oriented to person, place, and time.  Skin: Skin is warm and dry.  Psychiatric: She has a normal mood and affect. Her behavior is normal.  Nursing note and vitals reviewed.  BP 124/84 (BP Location: Right Arm, Patient Position: Sitting, Cuff Size: Small)   Pulse 88   Temp 97.7 F (36.5 C) (Oral)   Resp 16   Ht 5' 4.5" (1.638 m)   Wt 156 lb 9.6 oz (71 kg)   SpO2 100%   BMI 26.47 kg/m      Assessment & Plan:   1. Hyperlipidemia, unspecified hyperlipidemia type   2. Medication monitoring encounter   3. Rheumatoid arteritis    Pt here to establish care. She has been stable on methotrexate for RA for a longtime. Really likes her rheum Dr. Rogers Blocker at [facility] but not financially able to cont paying specialist copays so as long as she is stable I will start prescribing this.  Orders Placed This Encounter  Procedures  . Pneumococcal polysaccharide vaccine 23-valent greater than or equal to 2yo  subcutaneous/IM  . Lipid panel    Order Specific Question:   Has the patient fasted?    Answer:   Yes  . Comprehensive metabolic panel  . Sedimentation Rate  . Care order/instruction:    AVS printed - let patient go!     I personally performed the services described in this documentation, which was scribed in my presence. The recorded information has been reviewed and considered, and addended by me as needed.   Delman Cheadle, M.D.  Primary Care at [facility]  Health [address] [place], [place] [address] [phone] phone [phone] fax  05/18/16 11:06 PM

## 2016-05-02 ENCOUNTER — Other Ambulatory Visit: Payer: Self-pay

## 2016-05-02 LAB — LIPID PANEL
CHOLESTEROL TOTAL: 181 mg/dL (ref 100–199)
Chol/HDL Ratio: 3.9 ratio units (ref 0.0–4.4)
HDL: 47 mg/dL (ref >39)
LDL CALC: 114 mg/dL — AB (ref 0–99)
TRIGLYCERIDES: 98 mg/dL (ref 0–149)
VLDL CHOLESTEROL CAL: 20 mg/dL (ref 5–40)

## 2016-05-02 LAB — COMPREHENSIVE METABOLIC PANEL
A/G RATIO: 1.5 (ref 1.2–2.2)
ALT: 10 IU/L (ref 0–32)
AST: 14 IU/L (ref 0–40)
Albumin: 4.2 g/dL (ref 3.6–4.8)
Alkaline Phosphatase: 82 IU/L (ref 39–117)
BUN/Creatinine Ratio: 12 (ref 12–28)
BUN: 6 mg/dL — AB (ref 8–27)
Bilirubin Total: <0.2 mg/dL (ref 0.0–1.2)
CALCIUM: 9.2 mg/dL (ref 8.7–10.3)
CO2: 23 mmol/L (ref 18–29)
Chloride: 100 mmol/L (ref 96–106)
Creatinine, Ser: 0.5 mg/dL — ABNORMAL LOW (ref 0.57–1.00)
GFR calc Af Amer: 120 mL/min/{1.73_m2} (ref >59)
GFR, EST NON AFRICAN AMERICAN: 104 mL/min/{1.73_m2} (ref >59)
GLUCOSE: 96 mg/dL (ref 65–99)
Globulin, Total: 2.8 g/dL (ref 1.5–4.5)
POTASSIUM: 4.8 mmol/L (ref 3.5–5.2)
Sodium: 138 mmol/L (ref 134–144)
TOTAL PROTEIN: 7 g/dL (ref 6.0–8.5)

## 2016-05-02 MED ORDER — METHOTREXATE 2.5 MG PO TABS: 15.0000 mg | ORAL_TABLET | ORAL | 1 refills | Status: DC

## 2016-05-02 NOTE — Telephone Encounter (Signed)
Pt states that she saw dr Brigitte Pulse yesterday and forgot to get a refill on Methotrexate   Best number [phone]

## 2016-05-19 ENCOUNTER — Ambulatory Visit (INDEPENDENT_AMBULATORY_CARE_PROVIDER_SITE_OTHER): Payer: BLUE CROSS/BLUE SHIELD | Admitting: Physician Assistant

## 2016-05-19 VITALS — BP 140/84 | HR 89 | Temp 98.0°F | Ht 64.5 in | Wt 155.4 lb

## 2016-05-19 DIAGNOSIS — R29898 Other symptoms and signs involving the musculoskeletal system: Secondary | ICD-10-CM

## 2016-05-19 LAB — POCT CBC
Granulocyte percent: 59.7 %G (ref 37–80)
HEMATOCRIT: 38.1 % (ref 37.7–47.9)
Hemoglobin: 13.1 g/dL (ref 12.2–16.2)
LYMPH, POC: 2.5 (ref 0.6–3.4)
MCH, POC: 30.9 pg (ref 27–31.2)
MCHC: 34.4 g/dL (ref 31.8–35.4)
MCV: 89.7 fL (ref 80–97)
MID (cbc): 0.7 (ref 0–0.9)
MPV: 6.9 fL (ref 0–99.8)
POC GRANULOCYTE: 4.7 (ref 2–6.9)
POC LYMPH %: 31.7 % (ref 10–50)
POC MID %: 8.6 % (ref 0–12)
Platelet Count, POC: 339 10*3/uL (ref 142–424)
RBC: 4.25 M/uL (ref 4.04–5.48)
RDW, POC: 13.8 %
WBC: 7.9 10*3/uL (ref 4.6–10.2)

## 2016-05-19 NOTE — Progress Notes (Signed)
[facility] [address], [place] [address] [phone]  Date:  05/19/2016   Name:  Alice Graham   DOB:  25-Mar-1956   MRN:  [id number]  PCP:  Delman Cheadle, MD   Chief Complaint  Patient presents with  . Photophobia    X1 day  Pt stated that she felt light headed, weakness in both legs- pt states that she would like an EKG done     History of Present Illness:  Alice Graham is a 61 y.o. female patient who presents to [facility] for cc of weakness in extremities.     Yesterday morning, she was brushinbg her teeth.  She had a warm sensation and wobbly sensation in her legs.  She could not walk.  She sat on the sink.  She could not move her legs.  This lasted for a few seconds.  She sat down, and sensation resolves.  She felt her heart pounding but was unsure if she was nervous from her symptoms.  She was not really leaned over the sink.  Legs not locked.  Symptoms resolved within 5 minutes for her to take a seat in a chair, and 5 minutes passed and symptoms completely resolved.  No diaphoresis, nausea blurry vision or vision changes.  No facial numbness or tingling.  She noticed no slurring of her words.  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  She had no headache.  No facial or UE symptoms.  No hx of seizures. No UR symptoms within the last month.    She has a hx of back pain.  This has occurred one another time, when she was standing at the door 49month ago.  She did not have this evaluated at this time.   She has a hx of ocular migraines per opthalmologist.  She had no auras at the time of this current incident.   Patient Active Problem List   Diagnosis Date Noted  . Elevated BP 10/13/2014  . Rheumatoid arthritis ([place] 01/09/2013  . Hyperlipidemia 01/09/2013    Past Medical History:  Diagnosis Date  . Arthritis   . Hyperlipidemia   . Hypertension     Past Surgical History:  Procedure Laterality Date  . childbirth    . TUBAL LIGATION       Social History  Substance Use Topics  . Smoking status: Former SResearch scientist (life sciences) . Smokeless tobacco: Never Used  . Alcohol use Yes     Comment: very rare use     Family History  Problem Relation Age of Onset  . Heart failure    . Stroke    . Diabetes    . COPD    . Diabetes Mother   . Heart disease Mother 527   MI that resulted in death  . Cancer Father 660   colon cancer  . Heart disease Father   . Hyperlipidemia Father   . Hypertension Father   . Stroke Father     No Known Allergies  Medication list has been reviewed and updated.  Current Outpatient Prescriptions on File Prior to Visit  Medication Sig Dispense Refill  . Acetaminophen (TYLENOL EXTRA STRENGTH PO) Take by mouth.    .Marland Kitchenalbuterol (PROVENTIL HFA;VENTOLIN HFA) 108 (90 Base) MCG/ACT inhaler Inhale 2 puffs into the lungs every 6 (six) hours as needed for wheezing or shortness of breath. 1 Inhaler 0  . aspirin 81 MG tablet Take 81 mg by mouth daily.    .Marland Kitchen  atorvastatin (LIPITOR) 20 MG tablet Take 1 tablet (20 mg total) by mouth daily. 90 tablet 2  . celecoxib (CELEBREX) 200 MG capsule Take 200 mg by mouth 2 (two) times daily.    Mariane Baumgarten Calcium (STOOL SOFTENER PO) Take by mouth.    . fluticasone (FLONASE) 50 MCG/ACT nasal spray Place 2 sprays into both nostrils daily. 16 g 6  . folic acid (FOLVITE) 1 MG tablet Take 1 mg by mouth daily.    . methotrexate (RHEUMATREX) 2.5 MG tablet Take 6 tablets (15 mg total) by mouth once a week. Caution:Chemotherapy. Protect from light. 84 tablet 1  . omeprazole (PRILOSEC) 20 MG capsule Take 1 capsule (20 mg total) by mouth daily. 30 capsule 4   No current facility-administered medications on file prior to visit.     ROS ROS otherwise unremarkable unless listed above.   Physical Examination: BP 140/84 (BP Location: Right Arm, Patient Position: Sitting, Cuff Size: Small)   Pulse 89   Temp 98 F (36.7 C) (Oral)   Ht 5' 4.5" (1.638 m)   Wt 155 lb 6.4 oz (70.5 kg)   SpO2 97%    BMI 26.26 kg/m  Ideal Body Weight: Weight in (lb) to have BMI = 25: 147.6  Physical Exam  Constitutional: She is oriented to person, place, and time. She appears well-developed and well-nourished. No distress.  HENT:  Head: Normocephalic and atraumatic.  Right Ear: External ear normal.  Left Ear: External ear normal.  Eyes: Conjunctivae and EOM are normal. Pupils are equal, round, and reactive to light.  Cardiovascular: Normal rate and regular rhythm.  Exam reveals no gallop and no friction rub.   No murmur heard. Pulses:      Carotid pulses are 2+ on the right side, and 2+ on the left side.      Radial pulses are 2+ on the right side, and 2+ on the left side.       Dorsalis pedis pulses are 2+ on the right side, and 2+ on the left side.  Pulmonary/Chest: Effort normal. No apnea. No respiratory distress. She has no decreased breath sounds. She has no wheezes. She has no rhonchi.  Neurological: She is alert and oriented to person, place, and time. No cranial nerve deficit. Gait (secondary to RA) abnormal. Coordination normal.  f to n, and Heel to shin normal.  Rapid upper extremity and lower extremity movement normal.  Normal pronator drift exam.    Skin: She is not diaphoretic.  Psychiatric: She has a normal mood and affect. Her behavior is normal.   Results for orders placed or performed in visit on 05/19/16  CMP14+EGFR  Result Value Ref Range   Glucose 86 65 - 99 mg/dL   BUN 8 8 - 27 mg/dL   Creatinine, Ser 0.51 (L) 0.57 - 1.00 mg/dL   GFR calc non Af Amer 103 >59 mL/min/1.73   GFR calc Af Amer 119 >59 mL/min/1.73   BUN/Creatinine Ratio 16 12 - 28   Sodium 139 134 - 144 mmol/L   Potassium 4.8 3.5 - 5.2 mmol/L   Chloride 99 96 - 106 mmol/L   CO2 22 18 - 29 mmol/L   Calcium 9.1 8.7 - 10.3 mg/dL   Total Protein 7.1 6.0 - 8.5 g/dL   Albumin 4.3 3.6 - 4.8 g/dL   Globulin, Total 2.8 1.5 - 4.5 g/dL   Albumin/Globulin Ratio 1.5 1.2 - 2.2   Bilirubin Total <0.2 0.0 - 1.2 mg/dL    Alkaline Phosphatase 82  39 - 117 IU/L   AST 13 0 - 40 IU/L   ALT 10 0 - 32 IU/L  CBC  Result Value Ref Range   WBC WILL FOLLOW    RBC WILL FOLLOW    Hemoglobin WILL FOLLOW    Hematocrit WILL FOLLOW    MCV WILL FOLLOW    MCH WILL FOLLOW    MCHC WILL FOLLOW    RDW WILL FOLLOW    Platelets WILL FOLLOW    Hematology Comments: WILL FOLLOW    NRBC WILL FOLLOW   POCT CBC  Result Value Ref Range   WBC 7.9 4.6 - 10.2 K/uL   Lymph, poc 2.5 0.6 - 3.4   POC LYMPH PERCENT 31.7 10 - 50 %L   MID (cbc) 0.7 0 - 0.9   POC MID % 8.6 0 - 12 %M   POC Granulocyte 4.7 2 - 6.9   Granulocyte percent 59.7 37 - 80 %G   RBC 4.25 4.04 - 5.48 M/uL   Hemoglobin 13.1 12.2 - 16.2 g/dL   HCT, POC 38.1 37.7 - 47.9 %   MCV 89.7 80 - 97 fL   MCH, POC 30.9 27 - 31.2 pg   MCHC 34.4 31.8 - 35.4 g/dL   RDW, POC 13.8 %   Platelet Count, POC 339 142 - 424 K/uL   MPV 6.9 0 - 99.8 fL   No data found.     Assessment and Plan: Jamey Demchak is a 61 y.o. female who is here today for weakness of lower extremity. Lab are unremarkable here.  At this time, neurology consult is appreciated.  Can not rule out tia at this time, though her symptoms completely resolved.  ekg unremarkable   Weakness of both lower extremities - Plan: CMP14+EGFR, EKG 12-Lead, POCT CBC, Ambulatory referral to Neurology, CANCELED: CBC  Ivar Drape, PA-C [facility] 2/25/20189:11 PM

## 2016-05-19 NOTE — Patient Instructions (Addendum)
  Please await contact for the neurology referral. If this occurs again, and you have any numbness or tingling, chest pain, palpitations,  fainting--please return.   IF you received an x-ray today, you will receive an invoice from [facility]. Please contact [facility] at [phone] with questions or concerns regarding your invoice.   IF you received labwork today, you will receive an invoice from [place]. Please contact [facility] at [phone] with questions or concerns regarding your invoice.   Our billing staff will not be able to assist you with questions regarding bills from these companies.  You will be contacted with the lab results as soon as they are available. The fastest way to get your results is to activate your My Chart account. Instructions are located on the last page of this paperwork. If you have not heard from [place] regarding the results in 2 weeks, please contact this office.

## 2016-05-20 ENCOUNTER — Encounter: Payer: Self-pay | Admitting: Physician Assistant

## 2016-05-21 LAB — CMP14+EGFR
A/G RATIO: 1.5 (ref 1.2–2.2)
ALBUMIN: 4.3 g/dL (ref 3.6–4.8)
ALK PHOS: 82 IU/L (ref 39–117)
ALT: 10 IU/L (ref 0–32)
AST: 13 IU/L (ref 0–40)
BUN / CREAT RATIO: 16 (ref 12–28)
BUN: 8 mg/dL (ref 8–27)
CHLORIDE: 99 mmol/L (ref 96–106)
CO2: 22 mmol/L (ref 18–29)
Calcium: 9.1 mg/dL (ref 8.7–10.3)
Creatinine, Ser: 0.51 mg/dL — ABNORMAL LOW (ref 0.57–1.00)
GFR calc non Af Amer: 103 mL/min/{1.73_m2} (ref >59)
GFR, EST AFRICAN AMERICAN: 119 mL/min/{1.73_m2} (ref >59)
GLUCOSE: 86 mg/dL (ref 65–99)
Globulin, Total: 2.8 g/dL (ref 1.5–4.5)
POTASSIUM: 4.8 mmol/L (ref 3.5–5.2)
Sodium: 139 mmol/L (ref 134–144)
TOTAL PROTEIN: 7.1 g/dL (ref 6.0–8.5)

## 2016-05-21 LAB — CBC

## 2016-05-23 ENCOUNTER — Ambulatory Visit (INDEPENDENT_AMBULATORY_CARE_PROVIDER_SITE_OTHER): Payer: BLUE CROSS/BLUE SHIELD | Admitting: Neurology

## 2016-05-23 ENCOUNTER — Encounter: Payer: Self-pay | Admitting: Neurology

## 2016-05-23 VITALS — BP 140/78 | HR 78 | Ht 64.0 in | Wt 158.0 lb

## 2016-05-23 DIAGNOSIS — R55 Syncope and collapse: Secondary | ICD-10-CM | POA: Diagnosis not present

## 2016-05-23 DIAGNOSIS — R29898 Other symptoms and signs involving the musculoskeletal system: Secondary | ICD-10-CM

## 2016-05-23 MED ORDER — ALPRAZOLAM 0.5 MG PO TABS: ORAL_TABLET | ORAL | 0 refills | Status: DC

## 2016-05-23 NOTE — Progress Notes (Signed)
Reason for visit: Leg weakness  Referring physician: Dr. Leonard Schwartz [name] is a 61 y.o. female  History of present illness:  Alice Graham is a 61 year old right-handed white female with a history of rheumatoid arthritis. The patient has been sent to this office for evaluation of 2 episodes that occurred in the last 3 months. The patient had the first episode about 3 months ago with onset of dizziness within about 3 minutes after she went from a seated position to a standing position. The patient had a more recent event that occurred within the last 2 weeks of this office visit. The patient had onset of the symptoms about a minute after she went from a seating to standing position. The patient was brushing her teeth at that time, she felt her heart pounding, she felt lightheaded and some slight dimming of vision. The patient felt as if she could not move her legs, she sat on the sink area, and the episode resolved in about 5 minutes. The patient denied any diaphoresis or overt chest pain. She checked her blood pressure after the event and it was 140/95. Her blood sugar was 166. The patient indicates that the second event was very similar to the first episode that occurred 3 months ago. She does have palpitations of the heart that occur on average twice a week, she may feel racing of the heart at times. She denies any focal numbness or weakness of extremities, but during the episode of leg weakness she felt some tingling in the legs. She does have some neck discomfort at times related to arthritis. She is sent to this office for further evaluation.  Past Medical History:  Diagnosis Date  . Arthritis   . Hyperlipidemia   . Hypertension     Past Surgical History:  Procedure Laterality Date  . childbirth    . TUBAL LIGATION      Family History  Problem Relation Age of Onset  . Heart failure    . Stroke    . Diabetes    . COPD    . Diabetes Mother   . Heart disease Mother 37    MI that  resulted in death  . Cancer Father 11    colon cancer  . Heart disease Father   . Hyperlipidemia Father   . Hypertension Father   . Stroke Father     Social history:  reports that she has quit smoking. She has never used smokeless tobacco. She reports that she does not drink alcohol or use drugs.  Medications:  Prior to Admission medications   Medication Sig Start Date End Date Taking? Authorizing Provider  Acetaminophen (TYLENOL EXTRA STRENGTH PO) Take by mouth.   Yes Historical Provider, MD  albuterol (PROVENTIL HFA;VENTOLIN HFA) 108 (90 Base) MCG/ACT inhaler Inhale 2 puffs into the lungs every 6 (six) hours as needed for wheezing or shortness of breath. 01/19/16  Yes Alveda Reasons, MD  aspirin 81 MG tablet Take 81 mg by mouth daily.   Yes Historical Provider, MD  atorvastatin (LIPITOR) 20 MG tablet Take 1 tablet (20 mg total) by mouth daily. 01/19/16  Yes Alveda Reasons, MD  celecoxib (CELEBREX) 200 MG capsule Take 200 mg by mouth 2 (two) times daily.   Yes Historical Provider, MD  Docusate Calcium (STOOL SOFTENER PO) Take by mouth.   Yes Historical Provider, MD  fluticasone (FLONASE) 50 MCG/ACT nasal spray Place 2 sprays into both nostrils daily. 01/19/16  Yes Alveda Reasons,  MD  folic acid (FOLVITE) 1 MG tablet Take 1 mg by mouth daily.   Yes Historical Provider, MD  methotrexate (RHEUMATREX) 2.5 MG tablet Take 6 tablets (15 mg total) by mouth once a week. Caution:Chemotherapy. Protect from light. 05/02/16  Yes Shawnee Knapp, MD  omeprazole (PRILOSEC) 20 MG capsule Take 1 capsule (20 mg total) by mouth daily. 06/30/12  Yes Clanford Marisa Hua, MD     No Known Allergies  ROS:  Out of a complete 14 system review of symptoms, the patient complains only of the following symptoms, and all other reviewed systems are negative:  Easy bruising Numbness, weakness, dizziness  Blood pressure 140/78, pulse 78, height 5\' 4"  (1.626 m), weight 158 lb (71.7 kg).   Blood pressure, right arm,  sitting is 158/80. Left pressure, right arm, standing is 168/88.  Physical Exam  General: The patient is alert and cooperative at the time of the examination.  Eyes: Pupils are equal, round, and reactive to light. Discs are flat bilaterally.  Neck: The neck is supple, no carotid bruits are noted.  Respiratory: The respiratory examination is clear.  Cardiovascular: The cardiovascular examination reveals a regular rate and rhythm, no obvious murmurs or rubs are noted.  Skin: Extremities are without significant edema.  Neurologic Exam  Mental status: The patient is alert and oriented x 3 at the time of the examination. The patient has apparent normal recent and remote memory, with an apparently normal attention span and concentration ability.  Cranial nerves: Facial symmetry is not  present. Slight ptosis of the left eye is noted. There is good sensation of the face to pinprick and soft touch bilaterally. The strength of the facial muscles and the muscles to head turning and shoulder shrug are normal bilaterally. Speech is well enunciated, no aphasia or dysarthria is noted. Extraocular movements are full. Visual fields are full. The tongue is midline, and the patient has symmetric elevation of the soft palate. No obvious hearing deficits are noted.  Motor: The motor testing reveals 5 over 5 strength of all 4 extremities. Good symmetric motor tone is noted throughout.  Sensory: Sensory testing is intact to pinprick, soft touch, vibration sensation, and position sense on all 4 extremities. No evidence of extinction is noted.  Coordination: Cerebellar testing reveals good finger-nose-finger and heel-to-shin bilaterally.  Gait and station: Gait is normal. Tandem gait is normal. Romberg is negative. No drift is seen.  Reflexes: Deep tendon reflexes are symmetric and normal bilaterally, with exception of some decrease in ankle jerk reflexes bilaterally. Toes are downgoing  bilaterally.   Assessment/Plan:  1. Transient leg weakness, probable near syncopal event  2. Rheumatoid arthritis  The patient had an event associated with dizziness, slight dimming of vision, palpitations of the heart, and difficulty with moving the legs. This likely represented a near syncopal event. The patient will undergo a workup to include MRI of the brain, carotid Doppler study, and a 30 day event monitor study. The patient will be called with the results of the above studies.  Jill Alexanders MD 05/23/2016 10:25 AM  [facility] [address] [place], [place] [address]  Phone [phone] Fax [phone]

## 2016-06-06 ENCOUNTER — Ambulatory Visit (INDEPENDENT_AMBULATORY_CARE_PROVIDER_SITE_OTHER): Payer: BLUE CROSS/BLUE SHIELD

## 2016-06-06 DIAGNOSIS — R29898 Other symptoms and signs involving the musculoskeletal system: Secondary | ICD-10-CM | POA: Diagnosis not present

## 2016-06-06 DIAGNOSIS — R55 Syncope and collapse: Secondary | ICD-10-CM

## 2016-06-08 ENCOUNTER — Telehealth: Payer: Self-pay | Admitting: Neurology

## 2016-06-08 NOTE — Telephone Encounter (Signed)
Pamala Hurry from [facility] called and stated that the patient had called her and asked about scheduling but the location on the referral says [address] so she wants to clarify that they are supposed to scheduled the patient. If not she would like for [place] to contact the patient and ask her if she wants be scheduled in [place] on [address]

## 2016-06-08 NOTE — Telephone Encounter (Signed)
Left vm for Pamala Hurry at scheduling department at [phone]. Rn stated office close at 1200pm. Rn will call later before 02.00pm

## 2016-06-08 NOTE — Telephone Encounter (Signed)
Barabra @ in the scheduling department of [place] said there is an order for a Cardiac Event Monitor for Pt Alice Graham and she has questions about it, she will be in office until 5:00

## 2016-06-19 ENCOUNTER — Other Ambulatory Visit: Payer: Self-pay

## 2016-06-19 ENCOUNTER — Other Ambulatory Visit: Payer: Self-pay | Admitting: Neurology

## 2016-06-19 NOTE — Telephone Encounter (Signed)
Patient is scheduled for 06/20/2016.

## 2016-06-20 ENCOUNTER — Telehealth: Payer: Self-pay | Admitting: Neurology

## 2016-06-20 ENCOUNTER — Ambulatory Visit (INDEPENDENT_AMBULATORY_CARE_PROVIDER_SITE_OTHER): Payer: BLUE CROSS/BLUE SHIELD

## 2016-06-20 DIAGNOSIS — R55 Syncope and collapse: Secondary | ICD-10-CM

## 2016-06-20 NOTE — Telephone Encounter (Signed)
I called patient. The carotid Doppler studies showed some blockage of the right external carotid artery, but the internal carotid artery flow was unremarkable.  MRI of the brain and the 30 day cardiac monitor study are pending.

## 2016-07-04 ENCOUNTER — Other Ambulatory Visit: Payer: Self-pay

## 2016-07-06 ENCOUNTER — Telehealth: Payer: Self-pay | Admitting: Family Medicine

## 2016-07-06 MED ORDER — OMEPRAZOLE 20 MG PO CPDR: 20.0000 mg | DELAYED_RELEASE_CAPSULE | Freq: Every day | ORAL | 0 refills | Status: DC

## 2016-07-06 NOTE — Telephone Encounter (Signed)
DR Brigitte Pulse PT NEEDING A RX ON PRILOSEC PLEASE SEND TO [place] ON [address]

## 2016-07-19 ENCOUNTER — Telehealth: Payer: Self-pay | Admitting: Family Medicine

## 2016-07-19 NOTE — Telephone Encounter (Signed)
Cyclobenzaprine is not on her medication list, not documented in the visit.

## 2016-07-19 NOTE — Telephone Encounter (Signed)
Pt called to request a refill of her generic Flexarel.  Brigitte Pulse had told her to call the office when she was ready for another refill.  Pt states that she uses the [facility] on the corner of [address].  [phone]

## 2016-07-20 ENCOUNTER — Encounter: Payer: Self-pay | Admitting: Family Medicine

## 2016-07-20 MED ORDER — CYCLOBENZAPRINE HCL 10 MG PO TABS: 10.0000 mg | ORAL_TABLET | Freq: Every evening | ORAL | 3 refills | Status: DC | PRN

## 2016-07-20 NOTE — Telephone Encounter (Signed)
Sent and [employer] informed pt

## 2016-07-26 ENCOUNTER — Ambulatory Visit (INDEPENDENT_AMBULATORY_CARE_PROVIDER_SITE_OTHER): Payer: BLUE CROSS/BLUE SHIELD | Admitting: Family Medicine

## 2016-07-26 ENCOUNTER — Ambulatory Visit (INDEPENDENT_AMBULATORY_CARE_PROVIDER_SITE_OTHER): Payer: BLUE CROSS/BLUE SHIELD

## 2016-07-26 ENCOUNTER — Encounter: Payer: Self-pay | Admitting: Family Medicine

## 2016-07-26 VITALS — BP 138/86 | HR 88 | Temp 97.5°F | Resp 16 | Ht 64.0 in | Wt 158.0 lb

## 2016-07-26 DIAGNOSIS — M545 Low back pain, unspecified: Secondary | ICD-10-CM

## 2016-07-26 DIAGNOSIS — I10 Essential (primary) hypertension: Secondary | ICD-10-CM | POA: Diagnosis not present

## 2016-07-26 DIAGNOSIS — G8929 Other chronic pain: Secondary | ICD-10-CM

## 2016-07-26 DIAGNOSIS — I00 Rheumatic fever without heart involvement: Secondary | ICD-10-CM

## 2016-07-26 DIAGNOSIS — R55 Syncope and collapse: Secondary | ICD-10-CM

## 2016-07-26 DIAGNOSIS — R5383 Other fatigue: Secondary | ICD-10-CM | POA: Diagnosis not present

## 2016-07-26 DIAGNOSIS — R011 Cardiac murmur, unspecified: Secondary | ICD-10-CM | POA: Diagnosis not present

## 2016-07-26 DIAGNOSIS — F5101 Primary insomnia: Secondary | ICD-10-CM

## 2016-07-26 DIAGNOSIS — M0520 Rheumatoid vasculitis with rheumatoid arthritis of unspecified site: Secondary | ICD-10-CM

## 2016-07-26 DIAGNOSIS — I6522 Occlusion and stenosis of left carotid artery: Secondary | ICD-10-CM

## 2016-07-26 LAB — POCT URINALYSIS DIP (MANUAL ENTRY)
BILIRUBIN UA: NEGATIVE
GLUCOSE UA: NEGATIVE mg/dL
Ketones, POC UA: NEGATIVE mg/dL
NITRITE UA: NEGATIVE
Protein Ur, POC: NEGATIVE mg/dL
RBC UA: NEGATIVE
Spec Grav, UA: <=1.005 — AB (ref 1.010–1.025)
Urobilinogen, UA: 0.2 E.U./dL
pH, UA: 6.5 (ref 5.0–8.0)

## 2016-07-26 MED ORDER — LISINOPRIL 10 MG PO TABS: 10.0000 mg | ORAL_TABLET | Freq: Every day | ORAL | 1 refills | Status: DC

## 2016-07-26 MED ORDER — ALPRAZOLAM 0.5 MG PO TABS: 0.5000 mg | ORAL_TABLET | Freq: Every evening | ORAL | 0 refills | Status: DC | PRN

## 2016-07-26 NOTE — Patient Instructions (Addendum)
IF you received an x-ray today, you will receive an invoice from [facility]. Please contact [facility] at [phone] with questions or concerns regarding your invoice.   IF you received labwork today, you will receive an invoice from [facility]. Please contact [facility] at [phone] with questions or concerns regarding your invoice.   Our billing staff will not be able to assist you with questions regarding bills from these companies.  You will be contacted with the lab results as soon as they are available. The fastest way to get your results is to activate your My Chart account. Instructions are located on the last page of this paperwork. If you have not heard from [place] regarding the results in 2 weeks, please contact this office.      Chronic Back Pain When back pain lasts longer than 3 months, it is called chronic back pain.The cause of your back pain may not be known. Some common causes include:  Wear and tear (degenerative disease) of the bones, ligaments, or disks in your back.  Inflammation and stiffness in your back (arthritis). People who have chronic back pain often go through certain periods in which the pain is more intense (flare-ups). Many people can learn to manage the pain with home care. Follow these instructions at home: Pay attention to any changes in your symptoms. Take these actions to help with your pain: Activity   Avoid bending and activities that make the problem worse.  Do not sit or stand in one place for long periods of time.  Take brief periods of rest throughout the day. This will reduce your pain. Resting in a lying or standing position is usually better than sitting to rest.  When you are resting for longer periods, mix in some mild activity or stretching between periods of rest. This will help to prevent stiffness and pain.  Get regular exercise. Ask your health care provider what activities are safe for you.  Do not lift  anything that is heavier than 10 lb (4.5 kg). Always use proper lifting technique, which includes:  Bending your knees.  Keeping the load close to your body.  Avoiding twisting. Managing pain   If directed, apply ice to the painful area. Your health care provider may recommend applying ice during the first 24-48 hours after a flare-up begins.  Put ice in a plastic bag.  Place a towel between your skin and the bag.  Leave the ice on for 20 minutes, 2-3 times per day.  After icing, apply heat to the affected area as often as told by your health care provider. Use the heat source that your health care provider recommends, such as a moist heat pack or a heating pad.  Place a towel between your skin and the heat source.  Leave the heat on for 20-30 minutes.  Remove the heat if your skin turns bright red. This is especially important if you are unable to feel pain, heat, or cold. You may have a greater risk of getting burned.  Try soaking in a warm tub.  Take over-the-counter and prescription medicines only as told by your health care provider.  Keep all follow-up visits as told by your health care provider. This is important. Contact a health care provider if:  You have pain that is not relieved with rest or medicine. Get help right away if:  You have weakness or numbness in one or both of your legs or feet.  You have trouble controlling your  bladder or your bowels.  You have nausea or vomiting.  You have pain in your abdomen.  You have shortness of breath or you faint. This information is not intended to replace advice given to you by your health care provider. Make sure you discuss any questions you have with your health care provider. Document Released: 04/19/2004 Document Revised: 07/21/2015 Document Reviewed: 08/30/2014 Elsevier Interactive Patient Education  2017 [employer].

## 2016-07-26 NOTE — Progress Notes (Signed)
Subjective:    Patient ID: Alice Graham, female    DOB: Feb 10, 1956, 61 y.o.   MRN: [id number] Chief Complaint  Patient presents with  . Follow-up    HTN/ pt also has a list a of concerns she would like to discuss  . Medication Refill    xanax and lisinopril    HPI  Alice Graham is a 61 y.o. female, with a h/o hyperlipidemia, who presents to Primary Care at [facility] for a 3 mo follow-up on her chronic medical conditions.  For the past 6 wks she has been feeling "rotten."  She did the 30d event monitor. She had to reschedule the MRI so unsure if this is contributing.  Her mid to lower back radiating to the left has become very paiunful. SHe knows she needs a left knee replacement and doesn't know if that is effecting it.  Had a rash. Rough dry patches occ come out - she thinks it is resolving with stopping oxyclean. Has been on celebrex x 11 yrs and went off for the post 3d and PB has been better 136-148/89-98 and was once 118/80 so she found some expired lisiniopril which she had never taken. She did not notice a severe worsening of pain.   HTN:  BP was 146/105 at home HLD: on Lipitor 20 RA: Pt has been on Methotrexate since 2007 for RA which she states has been stable. Pt has been taking Celebrex for a while as well; written twice daily but states that she takes it once a day. She typically sees an eye doctor once a year. On daily folic acid supp Osteoporosis: diagnosed on DEXA [facility] 07/[facility] IBS-C: controlled with oatmeal in diet and docusate GERD: prilosec Prn flexeril takes at bedtime so not sure it is helping her spasms but does not make her sleepy prn albuterol, flonase  Nausea She took an alprazolam and slept wonderfully Her deductible is to high for her to consider the knee replacement until after 61 yo - she did injections for as long as she could and then had an [place] to treat the bursitis with inj but was not bursitis though she has not tried the hyalurionic acid.   Past  Medical History:  Diagnosis Date  . Arthritis   . Hyperlipidemia   . Hypertension    Past Surgical History:  Procedure Laterality Date  . childbirth    . TUBAL LIGATION     Current Outpatient Prescriptions on File Prior to Visit  Medication Sig Dispense Refill  . Acetaminophen (TYLENOL EXTRA STRENGTH PO) Take by mouth.    Marland Kitchen albuterol (PROVENTIL HFA;VENTOLIN HFA) 108 (90 Base) MCG/ACT inhaler Inhale 2 puffs into the lungs every 6 (six) hours as needed for wheezing or shortness of breath. 1 Inhaler 0  . aspirin 81 MG tablet Take 81 mg by mouth daily.    Marland Kitchen atorvastatin (LIPITOR) 20 MG tablet Take 1 tablet (20 mg total) by mouth daily. 90 tablet 2  . celecoxib (CELEBREX) 200 MG capsule Take 200 mg by mouth 2 (two) times daily.    . cyclobenzaprine (FLEXERIL) 10 MG tablet Take 1 tablet (10 mg total) by mouth at bedtime as needed for muscle spasms. 30 tablet 3  . Docusate Calcium (STOOL SOFTENER PO) Take by mouth.    . fluticasone (FLONASE) 50 MCG/ACT nasal spray Place 2 sprays into both nostrils daily. 16 g 6  . folic acid (FOLVITE) 1 MG tablet Take 1 mg by mouth daily.    Marland Kitchen  omeprazole (PRILOSEC) 20 MG capsule Take 1 capsule (20 mg total) by mouth daily. 90 capsule 0   No current facility-administered medications on file prior to visit.    No Known Allergies Family History  Problem Relation Age of Onset  . Heart failure    . Stroke    . Diabetes    . COPD    . Diabetes Mother   . Heart disease Mother 85    MI that resulted in death  . Cancer Father 31    colon cancer  . Heart disease Father   . Hyperlipidemia Father   . Hypertension Father   . Stroke Father    Social History   Social History  . Marital status: Single    Spouse name: N/A  . Number of children: 2  . Years of education: college    Occupational History  . Nurse- [employer]    Social History Main Topics  . Smoking status: Former Research scientist (life sciences)  . Smokeless tobacco: Never Used    . Alcohol use No  . Drug use: No  . Sexual activity: Not Asked   Other Topics Concern  . None   Social History Narrative   Lives with daughter   Caffeine use: 2 cups daily coffee or large sweet tea almost daily   Depression screen [facility] 2/9 07/26/2016 05/19/2016 05/01/2016 01/19/2016 11/12/2014  Decreased Interest 0 0 0 0 0  Down, Depressed, Hopeless 0 0 0 0 0  PHQ - 2 Score 0 0 0 0 0    Review of Systems See hpi    Objective:   Physical Exam  Constitutional: She is oriented to person, place, and time. She appears well-developed and well-nourished.  HENT:  Head: Normocephalic.  Eyes: Conjunctivae are normal. No scleral icterus.  Neck: Normal range of motion. Neck supple.  Cardiovascular: Normal rate.  An irregularly irregular rhythm present.  Murmur heard.  Decrescendo systolic murmur is present with a grade of 2/6  occ skipped beats  Pulmonary/Chest: Effort normal and breath sounds normal. No respiratory distress.  Musculoskeletal: Normal range of motion. She exhibits no edema.       Right hip: Normal.       Left hip: Normal.       Lumbar back: She exhibits tenderness, pain and spasm. She exhibits normal range of motion, no bony tenderness and no deformity.  Neurological: She is alert and oriented to person, place, and time. She has normal strength and normal reflexes. No sensory deficit. She exhibits normal muscle tone. Coordination and gait normal.  Reflex Scores:      Patellar reflexes are 2+ on the right side and 2+ on the left side.      Achilles reflexes are 2+ on the right side and 2+ on the left side. Skin: Skin is warm and dry. No erythema.  Psychiatric: She has a normal mood and affect. Her behavior is normal.       BP 138/86   Pulse 88   Temp 97.5 F (36.4 C) (Oral)   Resp 16   Ht 5\' 4"  (1.626 m)   Wt 158 lb (71.7 kg)   SpO2 98%   BMI 27.12 kg/m   Dg Lumbar Spine 2-3 Views  Result Date: 07/26/2016 CLINICAL DATA:  Worsening back pain and stiffness EXAM:  LUMBAR SPINE - 2-3 VIEW COMPARISON:  None. FINDINGS: There is levoscoliosis of the lumbar spine with normal lumbar segmentation. There is grade 1 retrolisthesis  of L1 on L2 and L2 on L3 with small posterior marginal osteophytes and disc space narrowing at L2-3. Disc space narrowing is also seen at L1-2 and L5-S1 with associated facet joint space narrowing and sclerosis at L5-S1. The bones appear demineralized. The sacroiliac joints and pubic symphysis are maintained. There is slight joint space narrowing of both hips. Phleboliths are seen in the pelvis. Bowel gas pattern is unremarkable. There is aortic atherosclerosis without definite aneurysm. IMPRESSION: 1. Levoscoliosis of the upper lumbar spine with apex at L2. 2. Associated grade 1 retrolisthesis of L1 on L2 and L2 on L3. 3. Degenerative disc disease L1 through L3 and L5-S1. 4. Facet arthropathy at L5-S1. 5. No acute fracture. Electronically Signed   By: Ashley Royalty M.D.   On: 07/26/2016 15:13    . Assessment & Plan:   Could consider trial of prednisone burst to see what gets better from the RA vs what does not from .   1. Chronic left-sided low back pain without sciatica Try a- cetaminophen to relieve pain. Use flexeril during day to see if hleps back since does not cause fatigue per pt.  Patient had thought this was likely just resulting from her knee pain but unfortunately her x-ray shows no definite amount pathology causing the pain. Recommend PT eval and treat if continuous.   2. Fatigue, unspecified type   3. Undiagnosed cardiac murmurs - new, Refer to cardiology for further eval. Has not gotten results of 30d event monitor ordered by neurology  4. Stenosis of  carotid artery - following up with Dr. Jannifer Franklin  5. Near syncope - still under w/u by Dr. Jannifer Franklin  6.       Insomnia - ok to try low dose alprazolam - pt adamant that she will not get addicted/dependent and watch for tolerance - did very well on few tabs rx'd by Dr. Jannifer Franklin for MRI which  she tried in a pinch 7.       HTN - start lisinopril - recheck kin 4 wks for repeat bp and bmp 8.        RA - refilled methotrexate to [facility] x 6 mos  Orders Placed This Encounter  Procedures  . DG Lumbar Spine 2-3 Views    Standing Status:   Future    Number of Occurrences:   1    Standing Expiration Date:   07/26/2017    Order Specific Question:   Reason for Exam (SYMPTOM  OR DIAGNOSIS REQUIRED)    Answer:   worsening low back pain and stiffness going to left    Order Specific Question:   Preferred imaging location?    Answer:   External  . CBC  . Comprehensive metabolic panel  . TSH  . Vitamin B12  . Hemoglobin A1c  . Ambulatory referral to Cardiology    Referral Priority:   Routine    Referral Type:   Consultation    Referral Reason:   Specialty Services Required    Requested Specialty:   Cardiology    Number of Visits Requested:   1  . Care order/instruction:    Scheduling Instructions:     Complete orders, AVS and go.  Marland Kitchen POCT urinalysis dipstick    Meds ordered this encounter  Medications  . DISCONTD: lisinopril (PRINIVIL,ZESTRIL) 10 MG tablet    Sig: Take 10 mg by mouth daily.  Marland Kitchen ALPRAZolam (XANAX) 0.5 MG tablet    Sig: Take 1 tablet (0.5 mg total) by mouth at bedtime as  needed for anxiety. Take 2 tablets approximately 45 minutes prior to the MRI study, take a third tablet if needed.    Dispense:  30 tablet    Refill:  0  . lisinopril (PRINIVIL,ZESTRIL) 10 MG tablet    Sig: Take 1 tablet (10 mg total) by mouth daily.    Dispense:  30 tablet    Refill:  1  . methotrexate (RHEUMATREX) 2.5 MG tablet    Sig: Take 6 tablets (15 mg total) by mouth once a week. Caution:Chemotherapy. Protect from light.    Dispense:  84 tablet    Refill:  1     Delman Cheadle, M.D.  Primary Care at [facility] [address] [place], [place] [address] [phone] phone [phone] fax  07/28/16 11:43 PM

## 2016-07-27 LAB — TSH: TSH: 1.72 u[IU]/mL (ref 0.450–4.500)

## 2016-07-27 LAB — HEMOGLOBIN A1C
Est. average glucose Bld gHb Est-mCnc: 126 mg/dL
Hgb A1c MFr Bld: 6 % — ABNORMAL HIGH (ref 4.8–5.6)

## 2016-07-27 LAB — COMPREHENSIVE METABOLIC PANEL
A/G RATIO: 1.4 (ref 1.2–2.2)
ALBUMIN: 4.4 g/dL (ref 3.6–4.8)
ALT: 16 IU/L (ref 0–32)
AST: 17 IU/L (ref 0–40)
Alkaline Phosphatase: 85 IU/L (ref 39–117)
BUN/Creatinine Ratio: 16 (ref 12–28)
BUN: 7 mg/dL — ABNORMAL LOW (ref 8–27)
Bilirubin Total: <0.2 mg/dL (ref 0.0–1.2)
CALCIUM: 9.2 mg/dL (ref 8.7–10.3)
CO2: 24 mmol/L (ref 18–29)
CREATININE: 0.43 mg/dL — AB (ref 0.57–1.00)
Chloride: 97 mmol/L (ref 96–106)
GFR calc Af Amer: 126 mL/min/{1.73_m2} (ref >59)
GFR, EST NON AFRICAN AMERICAN: 109 mL/min/{1.73_m2} (ref >59)
GLOBULIN, TOTAL: 3.2 g/dL (ref 1.5–4.5)
Glucose: 97 mg/dL (ref 65–99)
POTASSIUM: 4.6 mmol/L (ref 3.5–5.2)
Sodium: 139 mmol/L (ref 134–144)
Total Protein: 7.6 g/dL (ref 6.0–8.5)

## 2016-07-27 LAB — CBC
HEMATOCRIT: 38.2 % (ref 34.0–46.6)
HEMOGLOBIN: 12.9 g/dL (ref 11.1–15.9)
MCH: 29.6 pg (ref 26.6–33.0)
MCHC: 33.8 g/dL (ref 31.5–35.7)
MCV: 88 fL (ref 79–97)
Platelets: 388 10*3/uL — ABNORMAL HIGH (ref 150–379)
RBC: 4.36 x10E6/uL (ref 3.77–5.28)
RDW: 15.5 % — AB (ref 12.3–15.4)
WBC: 7 10*3/uL (ref 3.4–10.8)

## 2016-07-27 LAB — VITAMIN B12

## 2016-07-28 MED ORDER — METHOTREXATE 2.5 MG PO TABS: 15.0000 mg | ORAL_TABLET | ORAL | 1 refills | Status: DC

## 2016-08-15 ENCOUNTER — Ambulatory Visit (INDEPENDENT_AMBULATORY_CARE_PROVIDER_SITE_OTHER): Payer: BLUE CROSS/BLUE SHIELD | Admitting: Family Medicine

## 2016-08-15 VITALS — BP 114/75 | HR 94 | Temp 98.6°F | Resp 16 | Ht 64.0 in | Wt 155.4 lb

## 2016-08-15 DIAGNOSIS — Z5181 Encounter for therapeutic drug level monitoring: Secondary | ICD-10-CM | POA: Diagnosis not present

## 2016-08-15 DIAGNOSIS — I10 Essential (primary) hypertension: Secondary | ICD-10-CM

## 2016-08-15 DIAGNOSIS — E538 Deficiency of other specified B group vitamins: Secondary | ICD-10-CM | POA: Diagnosis not present

## 2016-08-15 DIAGNOSIS — R7303 Prediabetes: Secondary | ICD-10-CM

## 2016-08-15 MED ORDER — CYANOCOBALAMIN 1000 MCG/ML IJ SOLN
1000.0000 ug | INTRAMUSCULAR | Status: DC
  Administered 2016-08-15 – 2016-12-19 (×4): 1000 ug via INTRAMUSCULAR

## 2016-08-15 NOTE — Patient Instructions (Addendum)
IF you received an x-ray today, you will receive an invoice from [facility]. Please contact [facility] at [phone] with questions or concerns regarding your invoice.   IF you received labwork today, you will receive an invoice from [facility]. Please contact [facility] at [phone] with questions or concerns regarding your invoice.   Our billing staff will not be able to assist you with questions regarding bills from these companies.  You will be contacted with the lab results as soon as they are available. The fastest way to get your results is to activate your My Chart account. Instructions are located on the last page of this paperwork. If you have not heard from [place] regarding the results in 2 weeks, please contact this office.      Vitamin B12 Deficiency Vitamin B12 deficiency occurs when the body does not have enough vitamin B12. Vitamin B12 is an important vitamin. The body needs vitamin B12:  To make red blood cells.  To make DNA. This is the genetic material inside cells.  To help the nerves work properly so they can carry messages from the brain to the body. Vitamin B12 deficiency can cause various health problems, such as a low red blood cell count (anemia) or nerve damage. What are the causes? This condition may be caused by:  Not eating enough foods that contain vitamin B12.  Not having enough stomach acid and digestive fluids to properly absorb vitamin B12 from the food that you eat.  Certain digestive system diseases that make it hard to absorb vitamin B12. These diseases include Crohn disease, chronic pancreatitis, and cystic fibrosis.  Pernicious anemia. This is a condition in which the body does not make enough of a protein (intrinsic factor), resulting in too few red blood cells.  Having a surgery in which part of the stomach or small intestine is removed.  Taking certain medicines that make it hard for the body to absorb vitamin B12.  These medicines include:  Heartburn medicine (antacids and proton pump inhibitors).  An antibiotic medicine called neomycin.  Some medicines that are used to treat diabetes, tuberculosis, gout, or high cholesterol. What increases the risk? The following factors may make you more likely to develop a B12 deficiency:  Being older than age 71.  Eating a vegetarian or vegan diet, especially while you are pregnant.  Eating a poor diet while you are pregnant.  Taking certain drugs.  Having alcoholism. What are the signs or symptoms? In some cases, there are no symptoms of this condition. If the condition leads to anemia or nerve damage, various symptoms can occur, such as:  Weakness.  Fatigue.  Loss of appetite.  Weight loss.  Numbness or tingling in your hands and feet.  Redness and burning of the tongue.  Confusion or memory problems.  Depression.  Sensory problems, such as color blindness, ringing in the ears, or loss of taste.  Diarrhea or constipation.  Trouble walking. If anemia is severe, symptoms can include:  Shortness of breath.  Dizziness.  Rapid heart rate (tachycardia). How is this diagnosed? This condition may be diagnosed with a blood test to measure the level of vitamin B12 in your blood. You may have other tests to help find the cause of your vitamin B12 deficiency. These tests may include:  A complete blood count (CBC). This is a group of tests that measure certain characteristics of blood cells.  A blood test to measure intrinsic factor.  An endoscopy. In this procedure,  a thin tube with a camera on the end is used to look into your stomach or intestines. How is this treated? Treatment for this condition depends on the cause. Common treatment options include:  Changing your eating and drinking habits, such as:  Eating more foods that contain vitamin B12.  Drinking less alcohol or no alcohol.  Taking vitamin B12 supplements. Your health  care provider will tell you which dosage is best for you.  Getting vitamin B12 injections. Follow these instructions at home:  Take supplements only as told by your health care provider. Follow the directions carefully.  Get any injections that are prescribed by your health care provider.  Do not miss your appointments.  Eat lots of healthy foods that contain vitamin B12. Ask your health care provider if you should work with a dietitian. Foods that contain vitamin B12 include:  Meat.  Meat from birds (poultry).  Fish.  Eggs.  Cereal and dairy products that are fortified. This means that vitamin B12 has been added to the food. Check the label on the package to see if the food is fortified.  Do not abuse alcohol.  Keep all follow-up visits as told by your health care provider. This is important. Contact a health care provider if:  Your symptoms come back. Get help right away if:  You develop shortness of breath.  You have chest pain.  You become dizzy or you lose consciousness. This information is not intended to replace advice given to you by your health care provider. Make sure you discuss any questions you have with your health care provider. Document Released: 06/04/2011 Document Revised: 08/24/2015 Document Reviewed: 07/28/2014 Elsevier Interactive Patient Education  2017 [employer].

## 2016-08-15 NOTE — Progress Notes (Signed)
Subjective:  By signing my name below, I, [name], attest that this documentation has been prepared under the direction and in the presence of Delman Cheadle, MD Electronically Signed: Ladene Artist, ED Scribe 08/15/2016 at 11:00 AM.   Patient ID: Alice Graham, female    DOB: 1955/12/22, 61 y.o.   MRN: [id number]  Chief Complaint  Patient presents with  . Hypertension    BP check    HPI Alice Graham is a 61 y.o. female who presents to Primary Care at [facility] for a follow-up on HTN. Last seen 3 weeks prior for left-sided low back pain at that time for which XR looked fine. Started on acetaminophen, Flexeril and recommended PT. Known to have a new cardiac murmur and referred to cardiology for eval. Started on Lisinopril for BP control. She had been monitoring her BP at home with readings of 140s/100s. We started on PRN low dose alprazolam for sleep which she had tried prior and worked very well for her. Still under evaluation by neurology Dr. Jannifer Franklin for near syncopal episode for carotid artery stenosis. 30 day monitoring still pending. Cardiology appointment in 1 week. Labs also revealed profound Vitamin B12 deficiency so recommended oral Vitamin B12 supplement and recheck in several weeks to ensure adequate response. Pt also developed pre-DM with a Hgb A1C of 6.0.   Pt states that she is doing much better since starting the Lisinopril. She has been checking her BP at home with average readings of 115/80-92. Pt denies light-headedness and dizziness.  Pt states that she has not started the Vitamin B12 supplements yet. She states that she has not worked since her last visit since she has had little to no energy. Pt states that she mopped and swept a few days ago but it took her approximately 2 hours to recover due to exhaustion. She noticed palpitations that she describes as skipped beats and nausea which resolved after having a peppermint. Pt denies chest pain or sob during that time.   Past Medical  History:  Diagnosis Date  . Arthritis   . Hyperlipidemia   . Hypertension    Current Outpatient Prescriptions on File Prior to Visit  Medication Sig Dispense Refill  . Acetaminophen (TYLENOL EXTRA STRENGTH PO) Take by mouth.    Marland Kitchen albuterol (PROVENTIL HFA;VENTOLIN HFA) 108 (90 Base) MCG/ACT inhaler Inhale 2 puffs into the lungs every 6 (six) hours as needed for wheezing or shortness of breath. 1 Inhaler 0  . ALPRAZolam (XANAX) 0.5 MG tablet Take 1 tablet (0.5 mg total) by mouth at bedtime as needed for anxiety. Take 2 tablets approximately 45 minutes prior to the MRI study, take a third tablet if needed. 30 tablet 0  . aspirin 81 MG tablet Take 81 mg by mouth daily.    Marland Kitchen atorvastatin (LIPITOR) 20 MG tablet Take 1 tablet (20 mg total) by mouth daily. 90 tablet 2  . cyclobenzaprine (FLEXERIL) 10 MG tablet Take 1 tablet (10 mg total) by mouth at bedtime as needed for muscle spasms. 30 tablet 3  . Docusate Calcium (STOOL SOFTENER PO) Take by mouth.    . fluticasone (FLONASE) 50 MCG/ACT nasal spray Place 2 sprays into both nostrils daily. 16 g 6  . folic acid (FOLVITE) 1 MG tablet Take 1 mg by mouth daily.    Marland Kitchen lisinopril (PRINIVIL,ZESTRIL) 10 MG tablet Take 1 tablet (10 mg total) by mouth daily. 30 tablet 1  . methotrexate (RHEUMATREX) 2.5 MG tablet Take 6 tablets (15 mg total) by mouth  once a week. Caution:Chemotherapy. Protect from light. 84 tablet 1  . celecoxib (CELEBREX) 200 MG capsule Take 200 mg by mouth 2 (two) times daily.    Marland Kitchen omeprazole (PRILOSEC) 20 MG capsule Take 1 capsule (20 mg total) by mouth daily. (Patient not taking: Reported on 08/15/2016) 90 capsule 0   No current facility-administered medications on file prior to visit.    No Known Allergies   Review of Systems  Constitutional: Positive for fatigue.  Respiratory: Negative for shortness of breath.   Cardiovascular: Positive for palpitations. Negative for chest pain.  Gastrointestinal: Positive for nausea (resolved).    Neurological: Negative for dizziness and light-headedness.      Objective:   Physical Exam  Constitutional: She is oriented to person, place, and time. She appears well-developed and well-nourished. No distress.  HENT:  Head: Normocephalic and atraumatic.  Eyes: Conjunctivae and EOM are normal.  Neck: Neck supple. No tracheal deviation present.  Cardiovascular: Regular rhythm.  Tachycardia present.   Murmur heard.  Systolic (in R upper sternal border) murmur is present with a grade of 1/6  Pulmonary/Chest: Effort normal and breath sounds normal. No respiratory distress.  Musculoskeletal: Normal range of motion.  Neurological: She is alert and oriented to person, place, and time.  Skin: Skin is warm and dry.  Psychiatric: She has a normal mood and affect. Her behavior is normal.  Nursing note and vitals reviewed.  BP 114/75   Pulse 94   Temp 98.6 F (37 C) (Oral)   Resp 16   Ht 5\' 4"  (1.626 m)   Wt 155 lb 5.8 oz (70.5 kg)   SpO2 96%   BMI 26.67 kg/m     30 day event monitoring reviewed, low HR of 100, high average of 110. Occasional PVC, all sinus rhythm. No pauses. Only manually triggered rhythm once. No sign findings during that period. Carotid dopplers showed moderate stenosis in the R ECA with bilateral mild to moderate plaque.   Assessment & Plan:   1. Vitamin B12 deficiency   2. Essential hypertension   3. Medication monitoring encounter   4. Prediabetes     Orders Placed This Encounter  Procedures  . Basic metabolic panel    Order Specific Question:   Has the patient fasted?    Answer:   No    Meds ordered this encounter  Medications  . cyanocobalamin ((VITAMIN B-12)) injection 1,000 mcg    I personally performed the services described in this documentation, which was scribed in my presence. The recorded information has been reviewed and considered, and addended by me as needed.   Delman Cheadle, M.D.  Primary Care at [facility] [address] [place], [place] [address] [phone] phone [phone] fax  08/28/16 2:53 AM

## 2016-08-16 LAB — BASIC METABOLIC PANEL
BUN / CREAT RATIO: 20 (ref 12–28)
BUN: 11 mg/dL (ref 8–27)
CO2: 22 mmol/L (ref 18–29)
CREATININE: 0.55 mg/dL — AB (ref 0.57–1.00)
Calcium: 9.3 mg/dL (ref 8.7–10.3)
Chloride: 97 mmol/L (ref 96–106)
GFR calc Af Amer: 116 mL/min/{1.73_m2} (ref >59)
GFR calc non Af Amer: 101 mL/min/{1.73_m2} (ref >59)
GLUCOSE: 127 mg/dL — AB (ref 65–99)
Potassium: 4.7 mmol/L (ref 3.5–5.2)
Sodium: 136 mmol/L (ref 134–144)

## 2016-08-17 ENCOUNTER — Telehealth: Payer: Self-pay | Admitting: Family Medicine

## 2016-08-17 NOTE — Telephone Encounter (Signed)
DR SHAW PT CALLING FOR A RETURN TO WORK NOTE FROM 07-26-16 THRU 08-15-16

## 2016-08-20 NOTE — Telephone Encounter (Signed)
She was seen 08/15/16 Can we write a note from 07/26/16-5/23?

## 2016-08-20 NOTE — Telephone Encounter (Signed)
Yes - that's find. Thank you.

## 2016-08-21 ENCOUNTER — Telehealth: Payer: Self-pay | Admitting: Neurology

## 2016-08-21 NOTE — Telephone Encounter (Signed)
I called patient. The cardiac monitor study was unremarkable, nothing that should be producing syncope.  Cardiac monitor study 08/21/16:  Sinus rhythm Rare premature atrial contractions No atrial fibrillation No sustained arrhythmias

## 2016-08-21 NOTE — Telephone Encounter (Signed)
Please let pt know letter is up front for pick up

## 2016-08-24 ENCOUNTER — Ambulatory Visit (INDEPENDENT_AMBULATORY_CARE_PROVIDER_SITE_OTHER): Payer: BLUE CROSS/BLUE SHIELD | Admitting: Cardiology

## 2016-08-24 ENCOUNTER — Encounter: Payer: Self-pay | Admitting: Cardiology

## 2016-08-24 DIAGNOSIS — R5383 Other fatigue: Secondary | ICD-10-CM

## 2016-08-24 DIAGNOSIS — R011 Cardiac murmur, unspecified: Secondary | ICD-10-CM

## 2016-08-24 DIAGNOSIS — R42 Dizziness and giddiness: Secondary | ICD-10-CM | POA: Diagnosis not present

## 2016-08-24 NOTE — Progress Notes (Signed)
Cardiology Office Note    Date:  08/24/2016   ID:  Savera Donson, DOB 1955/09/02, MRN [id number]  PCP:  Shawnee Knapp, MD  Cardiologist:  Fransico Him, MD   Chief Complaint  Patient presents with  . New Evaluation    heart murmur    History of Present Illness:  Alice Graham is a 61 y.o. female who is being seen today for the evaluation of fatigue, heart murmur and presyncope at the request of Shawnee Knapp, MD.  The patient has a history of hyperlipidemia and HTN as well as a history of tobacco use and family history of CAD with a massive MI at 93 due to untreated DM.  She says that for years she had been fatigued and over the past 6 months she developed severe fatigue to the point she could not get off of the couch or do anything.  She was also having severe dizziness at the time as well.  She went to see her PCP and was found to have a heart murmur. She also has noticed some palpitations as well.  She was diagnosed with severe Vit B12 deficiency.  Since going on B12 shots she feels like a new person.  All her symptoms have resolved including the palpitations.  She denies any chest pain or pressure, SOB, DOE, PND, orthopnea, LE edema or syncope.  Her dizziness, fatigue and palpitations have resolved.  She is still interested in finding out what her heart murmur is from.      Past Medical History:  Diagnosis Date  . Arthritis   . Hyperlipidemia   . Hypertension     Past Surgical History:  Procedure Laterality Date  . childbirth    . TUBAL LIGATION      Current Medications: Current Meds  Medication Sig  . Acetaminophen (TYLENOL EXTRA STRENGTH PO) Take by mouth.  Marland Kitchen albuterol (PROVENTIL HFA;VENTOLIN HFA) 108 (90 Base) MCG/ACT inhaler Inhale 2 puffs into the lungs every 6 (six) hours as needed for wheezing or shortness of breath.  . ALPRAZolam (XANAX) 0.5 MG tablet Take 1 tablet (0.5 mg total) by mouth at bedtime as needed for anxiety. Take 2 tablets approximately 45 minutes prior to the  MRI study, take a third tablet if needed.  Marland Kitchen aspirin 81 MG tablet Take 81 mg by mouth daily.  Marland Kitchen atorvastatin (LIPITOR) 20 MG tablet Take 1 tablet (20 mg total) by mouth daily.  . Cyanocobalamin (VITAMIN B-12) 2500 MCG SUBL Place 2,500 mcg under the tongue daily.  . cyclobenzaprine (FLEXERIL) 10 MG tablet Take 1 tablet (10 mg total) by mouth at bedtime as needed for muscle spasms.  Mariane Baumgarten Calcium (STOOL SOFTENER PO) Take by mouth.  . fluticasone (FLONASE) 50 MCG/ACT nasal spray Place 2 sprays into both nostrils daily.  . folic acid (FOLVITE) 1 MG tablet Take 1 mg by mouth daily.  Marland Kitchen lisinopril (PRINIVIL,ZESTRIL) 10 MG tablet Take 1 tablet (10 mg total) by mouth daily.  . methotrexate (RHEUMATREX) 2.5 MG tablet Take 6 tablets (15 mg total) by mouth once a week. Caution:Chemotherapy. Protect from light.   Current Facility-Administered Medications for the 08/24/16 encounter (Office Visit) with Sueanne Margarita, MD  Medication  . cyanocobalamin ((VITAMIN B-12)) injection 1,000 mcg    Allergies:   Patient has no known allergies.   Social History   Social History  . Marital status: Single    Spouse name: N/A  . Number of children: 2  . Years of education: college  Occupational History  . Nurse- [employer]    Social History Main Topics  . Smoking status: Former Research scientist (life sciences)  . Smokeless tobacco: Never Used  . Alcohol use No  . Drug use: No  . Sexual activity: Not Asked   Other Topics Concern  . None   Social History Narrative   Lives with daughter   Caffeine use: 2 cups daily coffee or large sweet tea almost daily     Family History:  The patient's family history includes Cancer (age of onset: 14) in her father; Diabetes in her mother; Heart disease in her father; Heart disease (age of onset: 74) in her mother; Heart failure in her mother; Hyperlipidemia in her father; Hypertension in her father; Stroke in her father.   ROS:   Please see the  history of present illness.    Review of Systems  Musculoskeletal: Positive for joint swelling.  Gastrointestinal: Positive for constipation.  Neurological: Positive for dizziness and loss of balance.   All other systems reviewed and are negative.  No flowsheet data found.     PHYSICAL EXAM:   VS:  BP 110/70   Pulse 90   Ht 5\' 4"  (1.626 m)   Wt 159 lb 12.8 oz (72.5 kg)   SpO2 94%   BMI 27.43 kg/m    GEN: Well nourished, well developed, in no acute distress  HEENT: normal  Neck: no JVD, carotid bruits, or masses Cardiac: RRR; no murmurs, rubs, or gallops,no edema.  Intact distal pulses bilaterally.  Respiratory:  clear to auscultation bilaterally, normal work of breathing GI: soft, nontender, nondistended, + BS MS: no deformity or atrophy  Skin: warm and dry, no rash Neuro:  Alert and Oriented x 3, Strength and sensation are intact Psych: euthymic mood, full affect  Wt Readings from Last 3 Encounters:  08/24/16 159 lb 12.8 oz (72.5 kg)  08/15/16 155 lb 5.8 oz (70.5 kg)  07/26/16 158 lb (71.7 kg)      Studies/Labs Reviewed:   EKG:  EKG is not  ordered today.    Recent Labs: 05/19/2016: Hemoglobin 13.1 07/26/2016: ALT 16; Platelets 388; TSH 1.720 08/15/2016: BUN 11; Creatinine, Ser 0.55; Potassium 4.7; Sodium 136   Lipid Panel    Component Value Date/Time   CHOL 181 05/01/2016 0934   TRIG 98 05/01/2016 0934   HDL 47 05/01/2016 0934   CHOLHDL 3.9 05/01/2016 0934   CHOLHDL 6.7 (H) 01/19/2016 1057   VLDL 32 (H) 01/19/2016 1057   LDLCALC 114 (H) 05/01/2016 0934    Additional studies/ records that were reviewed today include:  Office notes from PCP   ASSESSMENT:    1. Heart murmur   2. Dizziness   3. Other fatigue      PLAN:  In order of problems listed above:  1. Heart murmur - I do not hear a murmur today but I will get an echo to assess as some murmurs can come and go.  2. Dizziness - completely resolved after starting Vit B12 3. Severe fatigue -  resolved after going on B12.  She does have CRFs but is now completely asymptomatic and likely her symptoms were due to profound Vit B12 def. Her EKG from 04/2016  is nonischemic and she had a stress test years ago that was normal.  No further ischemic workup at this time unless she has symptoms.     Medication Adjustments/Labs and Tests Ordered: Current medicines are reviewed at length with  the patient today.  Concerns regarding medicines are outlined above.  Medication changes, Labs and Tests ordered today are listed in the Patient Instructions below.  There are no Patient Instructions on file for this visit.   Signed, Fransico Him, MD  08/24/2016 2:43 PM    [facility] [address], [place], [place]  [address] Phone: [phone]; Fax: [phone]

## 2016-08-24 NOTE — Patient Instructions (Signed)
Medication Instructions:  Your physician recommends that you continue on your current medications as directed. Please refer to the Current Medication list given to you today.   Labwork: None  Testing/Procedures: Your physician has requested that you have an echocardiogram. Echocardiography is a painless test that uses sound waves to create images of your heart. It provides your doctor with information about the size and shape of your heart and how well your heart's chambers and valves are working. This procedure takes approximately one hour. There are no restrictions for this procedure.  Follow-Up: Your physician recommends that you schedule a follow-up appointment AS NEEDED with Dr. Radford Pax pending study results.  Any Other Special Instructions Will Be Listed Below (If Applicable).     If you need a refill on your cardiac medications before your next appointment, please call your pharmacy.

## 2016-08-29 ENCOUNTER — Other Ambulatory Visit: Payer: Self-pay

## 2016-08-29 ENCOUNTER — Ambulatory Visit (HOSPITAL_COMMUNITY): Payer: BLUE CROSS/BLUE SHIELD | Attending: Cardiology

## 2016-08-29 DIAGNOSIS — R011 Cardiac murmur, unspecified: Secondary | ICD-10-CM

## 2016-09-06 ENCOUNTER — Telehealth: Payer: Self-pay | Admitting: Family Medicine

## 2016-09-06 NOTE — Telephone Encounter (Signed)
Pt is needing to get a refill on her folic acid 1mg  daily to be called in to [facility] [address]   Best number [phone]

## 2016-09-21 ENCOUNTER — Telehealth: Payer: Self-pay | Admitting: Family Medicine

## 2016-09-21 ENCOUNTER — Other Ambulatory Visit: Payer: Self-pay | Admitting: Family Medicine

## 2016-09-21 MED ORDER — FOLIC ACID 1 MG PO TABS: 1.0000 mg | ORAL_TABLET | Freq: Every day | ORAL | 3 refills | Status: DC

## 2016-09-21 MED ORDER — LISINOPRIL 10 MG PO TABS: 10.0000 mg | ORAL_TABLET | Freq: Every day | ORAL | 1 refills | Status: DC

## 2016-09-21 NOTE — Telephone Encounter (Signed)
Called and left message on patient's VM that both rx were sent to the pharmacy.

## 2016-09-21 NOTE — Telephone Encounter (Signed)
This phone note was never routed to anyone so never was addressed. I saw it when she called 2 wks later and then sent her folic acid into pharm on next phone note

## 2016-09-21 NOTE — Telephone Encounter (Signed)
Lisinopril and folic acid both sent to pharmacy.

## 2016-09-21 NOTE — Telephone Encounter (Signed)
Does this patient need an OV ?

## 2016-09-21 NOTE — Telephone Encounter (Signed)
Pt is calling for a refill on lisinopril  Best number [phone]

## 2016-10-03 ENCOUNTER — Other Ambulatory Visit: Payer: BLUE CROSS/BLUE SHIELD

## 2016-10-11 ENCOUNTER — Ambulatory Visit
Admission: RE | Admit: 2016-10-11 | Discharge: 2016-10-11 | Disposition: A | Discharge status: Home / Self Care | Payer: BLUE CROSS/BLUE SHIELD | Source: Ambulatory Visit | Attending: Neurology | Admitting: Neurology

## 2016-10-11 DIAGNOSIS — R55 Syncope and collapse: Secondary | ICD-10-CM

## 2016-10-11 DIAGNOSIS — R29898 Other symptoms and signs involving the musculoskeletal system: Secondary | ICD-10-CM | POA: Diagnosis not present

## 2016-10-14 ENCOUNTER — Telehealth: Payer: Self-pay | Admitting: Neurology

## 2016-10-14 NOTE — Telephone Encounter (Signed)
I called and talked with the daughter. The MRI of the brain shows minimal WM disease, C/W history of HTN, no acute changes.   MRI brain 10/12/16:  IMPRESSION:  Abnormal MRI scan of the brain showing small remote age right thalamic lacunar infarct and mild changes of chronic small vessel disease and paranasal sinusitis.

## 2016-10-17 ENCOUNTER — Encounter: Payer: Self-pay | Admitting: Family Medicine

## 2016-10-17 ENCOUNTER — Ambulatory Visit (INDEPENDENT_AMBULATORY_CARE_PROVIDER_SITE_OTHER): Payer: BLUE CROSS/BLUE SHIELD | Admitting: Family Medicine

## 2016-10-17 VITALS — BP 126/63 | HR 97 | Temp 97.4°F | Resp 18 | Ht 64.0 in | Wt 159.8 lb

## 2016-10-17 DIAGNOSIS — I10 Essential (primary) hypertension: Secondary | ICD-10-CM

## 2016-10-17 DIAGNOSIS — R21 Rash and other nonspecific skin eruption: Secondary | ICD-10-CM | POA: Diagnosis not present

## 2016-10-17 DIAGNOSIS — E538 Deficiency of other specified B group vitamins: Secondary | ICD-10-CM

## 2016-10-17 LAB — POCT SKIN KOH: Skin KOH, POC: NEGATIVE

## 2016-10-17 MED ORDER — LOSARTAN POTASSIUM 25 MG PO TABS: 25.0000 mg | ORAL_TABLET | Freq: Every day | ORAL | 1 refills | Status: DC

## 2016-10-17 NOTE — Progress Notes (Signed)
By signing my name below, I, Alice Graham, attest that this documentation has been prepared under the direction and in the presence of Alice Cheadle, MD.  Electronically Signed: Verlee Graham, Medical Scribe. 10/17/16. 2:15 PM.  Subjective:    Patient ID: Alice Graham, female    DOB: 01-06-1956, 61 y.o.   MRN: [id number]  HPI Chief Complaint  Patient presents with  . Rash    changed laundry soaps and delivered rash. Since has stopped and it has gotten better.   . Labs Only    Vitamin B12 levels checked     HPI Comments: Alice Graham is a 61 y.o. female who presents to Primary Care at [facility] complaining of rash. I saw pt 2 months prior and she had a new heart murmur and elevated bp. So had been started on lisinopril and referred to cardiology. Seen by Dr. Radford Pax. She's also had a profound Vit B12 defficiency and never tried oral Vit B12, which she was advised to take. After starting lisinopril bp decreased from 371G to 626R systolic, and 485I to 62V diastolic. Most of pt's systemic sxs with severe fatigue and dizziness completely resolved within several days of the B12 injection. Dr. Radford Pax did not appreciated any abnormality of exam.  Fatigue: It's been 2 months since her last Vit B12 injection. Pt is taking a Vit B 2500 supplement daily. She feels much better and states she feels like she's 61 y/o when she felt good. She no longer has wobbly legs, and her memory is better. She likes the injection better. Pt doesn't eat a lot of meat, but she does like fish.  Rash: Located at the back of her knees, arms, and back onset over a month and she used tide with oxy in it. Her rash stopped spreading after switching detergents but it's not improved. It occasionally itches when it's dry. Pt took lisinopril and broke out 2 weeks after taking it. Pt notes acute "small dry cough" and she used to smoke in the past.  Health Maintenance: Pt plans on scheduling her colonoscopy soon. She is curious about  alternative colon CA screening testing.  Patient Active Problem List   Diagnosis Date Noted  . Heart murmur 08/24/2016  . Dizziness 08/24/2016  . Fatigue 08/24/2016  . Leg weakness, bilateral 05/23/2016  . Elevated BP 10/13/2014  . Rheumatoid arthritis ([place]) 01/09/2013  . Hyperlipidemia 01/09/2013   Past Medical History:  Diagnosis Date  . Arthritis   . Hyperlipidemia   . Hypertension    Past Surgical History:  Procedure Laterality Date  . childbirth    . TUBAL LIGATION     No Known Allergies Prior to Admission medications   Medication Sig Start Date End Date Taking? Authorizing Provider  Acetaminophen (TYLENOL EXTRA STRENGTH PO) Take by mouth.   Yes [provider]  albuterol (PROVENTIL HFA;VENTOLIN HFA) 108 (90 Base) MCG/ACT inhaler Inhale 2 puffs into the lungs every 6 (six) hours as needed for wheezing or shortness of breath. 01/19/16  Yes Alveda Reasons, MD  ALPRAZolam Duanne Moron) 0.5 MG tablet Take 1 tablet (0.5 mg total) by mouth at bedtime as needed for anxiety. Take 2 tablets approximately 45 minutes prior to the MRI study, take a third tablet if needed. 07/26/16  Yes Shawnee Knapp, MD  aspirin 81 MG tablet Take 81 mg by mouth daily.   Yes [provider]  atorvastatin (LIPITOR) 20 MG tablet Take 1 tablet (20 mg total) by mouth daily. 01/19/16  Yes Esmond Camper  H, MD  Cyanocobalamin (VITAMIN B-12) 2500 MCG SUBL Place 2,500 mcg under the tongue daily.   Yes [provider]  cyclobenzaprine (FLEXERIL) 10 MG tablet Take 1 tablet (10 mg total) by mouth at bedtime as needed for muscle spasms. 07/20/16  Yes Shawnee Knapp, MD  Docusate Calcium (STOOL SOFTENER PO) Take by mouth.   Yes [provider]  fluticasone (FLONASE) 50 MCG/ACT nasal spray Place 2 sprays into both nostrils daily. 01/19/16  Yes Alveda Reasons, MD  folic acid (FOLVITE) 1 MG tablet Take 1 tablet (1 mg total) by mouth daily. 09/21/16  Yes Shawnee Knapp, MD  lisinopril  (PRINIVIL,ZESTRIL) 10 MG tablet Take 1 tablet (10 mg total) by mouth daily. 09/21/16  Yes Shawnee Knapp, MD  methotrexate (RHEUMATREX) 2.5 MG tablet Take 6 tablets (15 mg total) by mouth once a week. Caution:Chemotherapy. Protect from light. 07/28/16  Yes Shawnee Knapp, MD   Social History   Social History  . Marital status: Single    Spouse name: N/A  . Number of children: 2  . Years of education: college    Occupational History  . Nurse- [employer]    Social History Main Topics  . Smoking status: Former Research scientist (life sciences)  . Smokeless tobacco: Never Used  . Alcohol use No  . Drug use: No  . Sexual activity: Not on file   Other Topics Concern  . Not on file   Social History Narrative   Lives with daughter   Caffeine use: 2 cups daily coffee or large sweet tea almost daily   Review of Systems  Constitutional: Negative for fatigue.  Respiratory: Positive for cough.   Skin: Positive for rash.   Objective:  Physical Exam  Constitutional: She appears well-developed and well-nourished. No distress.  HENT:  Head: Normocephalic and atraumatic.  Eyes: Conjunctivae are normal.  Neck: Neck supple.  Cardiovascular: Normal rate, regular rhythm, S1 normal, S2 normal and normal heart sounds.  Exam reveals no gallop and no friction rub.   No murmur heard. Pulmonary/Chest: Effort normal and breath sounds normal. No respiratory distress. She has no wheezes. She has no rhonchi. She has no rales.  Neurological: She is alert.  Skin: Skin is warm and dry.  Very macular dry rash Some slight scale to it Rash did not fluorect under Wood's lamp  Psychiatric: She has a normal mood and affect. Her behavior is normal.  Nursing note and vitals reviewed.   Vitals:   10/17/16 1407  BP: 126/63  Pulse: 97  Resp: 18  Temp: (!) 97.4 F (36.3 C)  TempSrc: Oral  SpO2: 97%  Weight: 159 lb 12.8 oz (72.5 kg)  Height: 5\' 4"  (1.626 m)   Body mass index is 27.43 kg/m. Assessment &  Plan:   1. Vitamin B12 deficiency   2. Rash and nonspecific skin eruption - suspect reaction to changing clothing detergent.   3. Essential hypertension     Orders Placed This Encounter  Procedures  . CBC with Differential/Platelet  . Vitamin B12  . Intrinsic Factor Antibodies  . POCT Skin KOH    Meds ordered this encounter  Medications  . losartan (COZAAR) 25 MG tablet    Sig: Take 1 tablet (25 mg total) by mouth daily.    Dispense:  90 tablet    Refill:  1    I personally performed the services described in this documentation, which was scribed in my presence. The  recorded information has been reviewed and considered, and addended by me as needed.   Alice Graham, M.D.  Primary Care at [facility] [address] [place], [place] [address] [phone] phone [phone] fax  10/19/16 11:48 PM

## 2016-10-17 NOTE — Patient Instructions (Addendum)
Please come into the office once a month for a nurse only visit to continuing your B12 injections. Stop the lisinopril 10 mg and start losartan 25 mg. Continue a good thick moisturizing lotion for the rash and please let me know if it does not resolve. Recheck B12 level and blood pressure in about 3 months.  IF you received an x-ray today, you will receive an invoice from [facility]. Please contact [facility] at [phone] with questions or concerns regarding your invoice.   IF you received labwork today, you will receive an invoice from [facility]. Please contact [facility] at [phone] with questions or concerns regarding your invoice.   Our billing staff will not be able to assist you with questions regarding bills from these companies.  You will be contacted with the lab results as soon as they are available. The fastest way to get your results is to activate your My Chart account. Instructions are located on the last page of this paperwork. If you have not heard from [place] regarding the results in 2 weeks, please contact this office.      Vitamin B12 Deficiency Vitamin B12 deficiency occurs when the body does not have enough vitamin B12. Vitamin B12 is an important vitamin. The body needs vitamin B12:  To make red blood cells.  To make DNA. This is the genetic material inside cells.  To help the nerves work properly so they can carry messages from the brain to the body.  Vitamin B12 deficiency can cause various health problems, such as a low red blood cell count (anemia) or nerve damage. What are the causes? This condition may be caused by:  Not eating enough foods that contain vitamin B12.  Not having enough stomach acid and digestive fluids to properly absorb vitamin B12 from the food that you eat.  Certain digestive system diseases that make it hard to absorb vitamin B12. These diseases include Crohn disease, chronic pancreatitis, and cystic  fibrosis.  Pernicious anemia. This is a condition in which the body does not make enough of a protein (intrinsic factor), resulting in too few red blood cells.  Having a surgery in which part of the stomach or small intestine is removed.  Taking certain medicines that make it hard for the body to absorb vitamin B12. These medicines include: ? Heartburn medicine (antacids and proton pump inhibitors). ? An antibiotic medicine called neomycin. ? Some medicines that are used to treat diabetes, tuberculosis, gout, or high cholesterol.  What increases the risk? The following factors may make you more likely to develop a B12 deficiency:  Being older than age 32.  Eating a vegetarian or vegan diet, especially while you are pregnant.  Eating a poor diet while you are pregnant.  Taking certain drugs.  Having alcoholism.  What are the signs or symptoms? In some cases, there are no symptoms of this condition. If the condition leads to anemia or nerve damage, various symptoms can occur, such as:  Weakness.  Fatigue.  Loss of appetite.  Weight loss.  Numbness or tingling in your hands and feet.  Redness and burning of the tongue.  Confusion or memory problems.  Depression.  Sensory problems, such as color blindness, ringing in the ears, or loss of taste.  Diarrhea or constipation.  Trouble walking.  If anemia is severe, symptoms can include:  Shortness of breath.  Dizziness.  Rapid heart rate (tachycardia).  How is this diagnosed? This condition may be diagnosed with a blood test to measure  the level of vitamin B12 in your blood. You may have other tests to help find the cause of your vitamin B12 deficiency. These tests may include:  A complete blood count (CBC). This is a group of tests that measure certain characteristics of blood cells.  A blood test to measure intrinsic factor.  An endoscopy. In this procedure, a thin tube with a camera on the end is used to look  into your stomach or intestines.  How is this treated? Treatment for this condition depends on the cause. Common treatment options include:  Changing your eating and drinking habits, such as: ? Eating more foods that contain vitamin B12. ? Drinking less alcohol or no alcohol.  Taking vitamin B12 supplements. Your health care provider will tell you which dosage is best for you.  Getting vitamin B12 injections.  Follow these instructions at home:  Take supplements only as told by your health care provider. Follow the directions carefully.  Get any injections that are prescribed by your health care provider.  Do not miss your appointments.  Eat lots of healthy foods that contain vitamin B12. Ask your health care provider if you should work with a dietitian. Foods that contain vitamin B12 include: ? Meat. ? Meat from birds (poultry). ? Fish. ? Eggs. ? Cereal and dairy products that are fortified. This means that vitamin B12 has been added to the food. Check the label on the package to see if the food is fortified.  Do not abuse alcohol.  Keep all follow-up visits as told by your health care provider. This is important. Contact a health care provider if:  Your symptoms come back. Get help right away if:  You develop shortness of breath.  You have chest pain.  You become dizzy or you lose consciousness. This information is not intended to replace advice given to you by your health care provider. Make sure you discuss any questions you have with your health care provider. Document Released: 06/04/2011 Document Revised: 08/24/2015 Document Reviewed: 07/28/2014 Elsevier Interactive Patient Education  2018 [employer].

## 2016-10-18 LAB — CBC WITH DIFFERENTIAL/PLATELET
BASOS: 0 %
Basophils Absolute: 0 10*3/uL (ref 0.0–0.2)
EOS (ABSOLUTE): 0.1 10*3/uL (ref 0.0–0.4)
EOS: 1 %
HEMATOCRIT: 37.8 % (ref 34.0–46.6)
HEMOGLOBIN: 12.5 g/dL (ref 11.1–15.9)
IMMATURE GRANULOCYTES: 0 %
Immature Grans (Abs): 0 10*3/uL (ref 0.0–0.1)
LYMPHS ABS: 1.8 10*3/uL (ref 0.7–3.1)
Lymphs: 26 %
MCH: 30.1 pg (ref 26.6–33.0)
MCHC: 33.1 g/dL (ref 31.5–35.7)
MCV: 91 fL (ref 79–97)
MONOCYTES: 10 %
Monocytes Absolute: 0.7 10*3/uL (ref 0.1–0.9)
NEUTROS PCT: 63 %
Neutrophils Absolute: 4.4 10*3/uL (ref 1.4–7.0)
Platelets: 348 10*3/uL (ref 150–379)
RBC: 4.15 x10E6/uL (ref 3.77–5.28)
RDW: 14 % (ref 12.3–15.4)
WBC: 7 10*3/uL (ref 3.4–10.8)

## 2016-10-18 LAB — VITAMIN B12: Vitamin B-12: >2000 pg/mL — ABNORMAL HIGH (ref 232–1245)

## 2016-10-18 LAB — INTRINSIC FACTOR ANTIBODIES: INTRINSIC FACTOR ABS, SERUM: 276.7 [AU]/ml — AB (ref 0.0–1.1)

## 2016-10-29 ENCOUNTER — Telehealth: Payer: Self-pay | Admitting: Family Medicine

## 2016-10-29 NOTE — Telephone Encounter (Signed)
Pt states that she would like to change her blood pressure med and would like you to give her a call.  Please adv,  Contact number [phone]

## 2016-10-30 MED ORDER — CELECOXIB 100 MG PO CAPS: 100.0000 mg | ORAL_CAPSULE | Freq: Two times a day (BID) | ORAL | 2 refills | Status: DC

## 2016-10-30 NOTE — Telephone Encounter (Signed)
Called patient and informed her. Patient verbalized understanding.

## 2016-10-30 NOTE — Telephone Encounter (Signed)
My chart message sent.  Please confirm pt received it.   It would be fine for you to retry Celebrex. Please check your blood pressure 2-3 times a week starting several weeks after you resume the Celebrex. If your blood pressure is greater then 140/90 half of the time or more, please call so we can increase her losartan. A prescription has been sent to your pharmacy.

## 2016-10-30 NOTE — Telephone Encounter (Signed)
Patient called back. She states BP meds are fine. She wants to know if she can take celebrex again or any other NSAID. States she has been off celebrex for 2 months because in the past it caused an increase in her BP. Diagnosis of Rheumatoid Arthritis. Please advise.

## 2016-10-30 NOTE — Telephone Encounter (Signed)
Left message for patient to call back. Last OV was 10/17/16. Losartan was ordered as a new med. Lisinopril discontinued in patient's chart.

## 2016-11-07 NOTE — Telephone Encounter (Signed)
PATIENT STATES DR. SHAW PRESCRIBED HER TO HAVE LOSARTAN (COZAAR) 25 MG. DR. Brigitte Pulse TOLD HER TO TAKE HER BLOOD PRESSURE 3 TIMES A WEEK AND TO LET HER KNOW IF IT WAS ABOVE 140/90. LAST NIGHT (TUES) IT WAS 136/100. THE AVERAGE RANGE LAST WEEK WAS 140/96. SHE SAID DR. SHAW TOLD HER SHE MAY HAVE TO ADJUST IT. BEST PHONE [phone] (CELL) PHARMACY CHOICE IS [facility] ON [address]. [place]

## 2016-11-08 NOTE — Telephone Encounter (Signed)
Left message to return call 

## 2016-11-08 NOTE — Telephone Encounter (Signed)
Patient was retuning phone call.

## 2016-11-09 NOTE — Telephone Encounter (Signed)
Pt calling again stating that her blood pressure when after starting celebrex. Wants to know if she should adjust her dose and to what? BP is 145/102.

## 2016-11-12 ENCOUNTER — Encounter: Payer: Self-pay | Admitting: Family Medicine

## 2016-11-12 MED ORDER — LOSARTAN POTASSIUM 50 MG PO TABS: 50.0000 mg | ORAL_TABLET | Freq: Every day | ORAL | Status: DC

## 2016-11-12 NOTE — Telephone Encounter (Signed)
try doubling losartan from 25 to 50 mg. Take 2 a day of current pills.  Give this 1-2 weeks to take full effect, but in 3-4 weeks if BP is still elevated >140/90 on average at that time, please call again so we can again increase dose further or add in a low dose fluid pill.  When you need a refill on the losartan 25 mg, please let us know so we can sent in the 50 mg tab instead.

## 2016-11-13 NOTE — Telephone Encounter (Signed)
LVM to call back.

## 2016-11-16 ENCOUNTER — Encounter: Payer: Self-pay | Admitting: Physician Assistant

## 2016-11-16 ENCOUNTER — Ambulatory Visit (INDEPENDENT_AMBULATORY_CARE_PROVIDER_SITE_OTHER): Payer: BLUE CROSS/BLUE SHIELD | Admitting: Physician Assistant

## 2016-11-16 VITALS — BP 126/84 | HR 100 | Temp 98.0°F | Resp 16 | Ht 64.17 in | Wt 159.0 lb

## 2016-11-16 DIAGNOSIS — E538 Deficiency of other specified B group vitamins: Secondary | ICD-10-CM | POA: Diagnosis not present

## 2016-11-16 NOTE — Patient Instructions (Signed)
     IF you received an x-ray today, you will receive an invoice from [facility]. Please contact [facility] at [phone] with questions or concerns regarding your invoice.   IF you received labwork today, you will receive an invoice from [facility]. Please contact [facility] at [phone] with questions or concerns regarding your invoice.   Our billing staff will not be able to assist you with questions regarding bills from these companies.  You will be contacted with the lab results as soon as they are available. The fastest way to get your results is to activate your My Chart account. Instructions are located on the last page of this paperwork. If you have not heard from us regarding the results in 2 weeks, please contact this office.     

## 2016-11-16 NOTE — Progress Notes (Signed)
Injection only.  Order preexisting. Not seen by me. Philis Fendt, MS, PA-C 2:26 PM, 11/16/2016

## 2016-11-22 ENCOUNTER — Other Ambulatory Visit: Payer: Self-pay | Admitting: Family Medicine

## 2016-12-04 ENCOUNTER — Telehealth: Payer: Self-pay | Admitting: Family Medicine

## 2016-12-06 ENCOUNTER — Telehealth: Payer: Self-pay

## 2016-12-06 ENCOUNTER — Encounter: Payer: Self-pay | Admitting: Family Medicine

## 2016-12-06 ENCOUNTER — Other Ambulatory Visit: Payer: Self-pay | Admitting: Family Medicine
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
## 2016-12-06 NOTE — Telephone Encounter (Signed)
I will refill the Lipitor. Please advised on the Xanax.

## 2016-12-06 NOTE — Telephone Encounter (Signed)
Patient called in stating it has been 3 days since called in her refill for xanax and lipitor and she has not heard anything can we have someone check on this please. States she is almost out of them completely.

## 2016-12-06 NOTE — Telephone Encounter (Signed)
Called in rx for xanax to pharmacy. Pt made aware.

## 2016-12-19 ENCOUNTER — Encounter: Payer: Self-pay | Admitting: Family Medicine

## 2016-12-19 ENCOUNTER — Ambulatory Visit (INDEPENDENT_AMBULATORY_CARE_PROVIDER_SITE_OTHER): Payer: BLUE CROSS/BLUE SHIELD | Admitting: Family Medicine

## 2016-12-19 ENCOUNTER — Ambulatory Visit (INDEPENDENT_AMBULATORY_CARE_PROVIDER_SITE_OTHER): Payer: BLUE CROSS/BLUE SHIELD

## 2016-12-19 VITALS — BP 130/80 | HR 95 | Temp 98.1°F | Resp 18 | Ht 64.5 in | Wt 160.0 lb

## 2016-12-19 DIAGNOSIS — G4700 Insomnia, unspecified: Secondary | ICD-10-CM

## 2016-12-19 DIAGNOSIS — R002 Palpitations: Secondary | ICD-10-CM | POA: Diagnosis not present

## 2016-12-19 DIAGNOSIS — D510 Vitamin B12 deficiency anemia due to intrinsic factor deficiency: Secondary | ICD-10-CM | POA: Diagnosis not present

## 2016-12-19 DIAGNOSIS — Z1231 Encounter for screening mammogram for malignant neoplasm of breast: Secondary | ICD-10-CM

## 2016-12-19 DIAGNOSIS — R7303 Prediabetes: Secondary | ICD-10-CM

## 2016-12-19 DIAGNOSIS — R5382 Chronic fatigue, unspecified: Secondary | ICD-10-CM

## 2016-12-19 DIAGNOSIS — I10 Essential (primary) hypertension: Secondary | ICD-10-CM

## 2016-12-19 DIAGNOSIS — M069 Rheumatoid arthritis, unspecified: Secondary | ICD-10-CM

## 2016-12-19 DIAGNOSIS — E538 Deficiency of other specified B group vitamins: Secondary | ICD-10-CM | POA: Diagnosis not present

## 2016-12-19 DIAGNOSIS — Z1239 Encounter for other screening for malignant neoplasm of breast: Secondary | ICD-10-CM

## 2016-12-19 DIAGNOSIS — Z79899 Other long term (current) drug therapy: Secondary | ICD-10-CM

## 2016-12-19 MED ORDER — CYCLOBENZAPRINE HCL 10 MG PO TABS: ORAL_TABLET | ORAL | 2 refills | Status: DC

## 2016-12-19 MED ORDER — HYDROXYCHLOROQUINE SULFATE 200 MG PO TABS: 200.0000 mg | ORAL_TABLET | Freq: Two times a day (BID) | ORAL | 0 refills | Status: DC

## 2016-12-19 MED ORDER — AMLODIPINE BESYLATE 2.5 MG PO TABS: 2.5000 mg | ORAL_TABLET | Freq: Every day | ORAL | 1 refills | Status: DC

## 2016-12-19 MED ORDER — ALPRAZOLAM 0.5 MG PO TABS: ORAL_TABLET | ORAL | 1 refills | Status: DC

## 2016-12-19 NOTE — Patient Instructions (Addendum)
We recommend that you schedule a mammogram for breast cancer screening. Typically, you do not need a referral to do this. Please contact a local imaging center to schedule your mammogram.  [facility] - [phone]  *ask for the Radiology Department [facility]) - [phone] or [phone]  [facility] - [phone] [place] [phone] [facility] - [phone]  *ask for the [facility] - [phone]  *ask for the Radiology Department [facility] - [phone]  *ask for the [facility] - [phone]     IF you received an x-ray today, you will receive an invoice from [facility]. Please contact [facility] at [phone] with questions or concerns regarding your invoice.   IF you received labwork today, you will receive an invoice from [facility]. Please contact [facility] at [phone] with questions or concerns regarding your invoice.   Our billing staff will not be able to assist you with questions regarding bills from these companies.  You will be contacted with the lab results as soon as they are available. The fastest way to get your results is to activate your My Chart account. Instructions are located on the last page of this paperwork. If you have not heard from [place] regarding the results in 2 weeks, please contact this office.     Fatigue Fatigue is feeling tired all of the time, a lack of energy, or a lack of motivation. Occasional or mild fatigue is often a normal response to activity or life in general. However, long-lasting (chronic) or extreme fatigue may indicate an underlying medical condition. Follow these instructions at home: Watch your fatigue for any changes. The following actions may help to lessen any discomfort you are feeling:  Talk to your health care provider about how much  sleep you need each night. Try to get the required amount every night.  Take medicines only as directed by your health care provider.  Eat a healthy and nutritious diet. Ask your health care provider if you need help changing your diet.  Drink enough fluid to keep your urine clear or pale yellow.  Practice ways of relaxing, such as yoga, meditation, massage therapy, or acupuncture.  Exercise regularly.  Change situations that cause you stress. Try to keep your work and personal routine reasonable.  Do not abuse illegal drugs.  Limit alcohol intake to no more than 1 drink per day for nonpregnant women and 2 drinks per day for men. One drink equals 12 ounces of beer, 5 ounces of wine, or 1 ounces of hard liquor.  Take a multivitamin, if directed by your health care provider.  Contact a health care provider if:  Your fatigue does not get better.  You have a fever.  You have unintentional weight loss or gain.  You have headaches.  You have difficulty: ? Falling asleep. ? Sleeping throughout the night.  You feel angry, guilty, anxious, or sad.  You are unable to have a bowel movement (constipation).  You skin is dry.  Your legs or another part of your body is swollen. Get help right away if:  You feel confused.  Your vision is blurry.  You feel faint or pass out.  You have a severe headache.  You have severe abdominal, pelvic, or back pain.  You have chest pain, shortness of breath, or an irregular or fast heartbeat.  You are unable to urinate or  you urinate less than normal.  You develop abnormal bleeding, such as bleeding from the rectum, vagina, nose, lungs, or nipples.  You vomit blood.  You have thoughts about harming yourself or committing suicide.  You are worried that you might harm someone else. This information is not intended to replace advice given to you by your health care provider. Make sure you discuss any questions you have with your health  care provider. Document Released: 01/07/2007 Document Revised: 08/18/2015 Document Reviewed: 07/14/2013 Elsevier Interactive Patient Education  2018 [employer].  Managing Your Hypertension Hypertension is commonly called high blood pressure. This is when the force of your blood pressing against the walls of your arteries is too strong. Arteries are blood vessels that carry blood from your heart throughout your body. Hypertension forces the heart to work harder to pump blood, and may cause the arteries to become narrow or stiff. Having untreated or uncontrolled hypertension can cause heart attack, stroke, kidney disease, and other problems. What are blood pressure readings? A blood pressure reading consists of a higher number over a lower number. Ideally, your blood pressure should be below 120/80. The first ("top") number is called the systolic pressure. It is a measure of the pressure in your arteries as your heart beats. The second ("bottom") number is called the diastolic pressure. It is a measure of the pressure in your arteries as the heart relaxes. What does my blood pressure reading mean? Blood pressure is classified into four stages. Based on your blood pressure reading, your health care provider may use the following stages to determine what type of treatment you need, if any. Systolic pressure and diastolic pressure are measured in a unit called mm Hg. Normal  Systolic pressure: below 627.  Diastolic pressure: below 80. Elevated  Systolic pressure: 035-009.  Diastolic pressure: below 80. Hypertension stage 1  Systolic pressure: 381-829.  Diastolic pressure: 93-71. Hypertension stage 2  Systolic pressure: 696 or above.  Diastolic pressure: 90 or above. What health risks are associated with hypertension? Managing your hypertension is an important responsibility. Uncontrolled hypertension can lead to:  A heart attack.  A stroke.  A weakened blood vessel  (aneurysm).  Heart failure.  Kidney damage.  Eye damage.  Metabolic syndrome.  Memory and concentration problems.  What changes can I make to manage my hypertension? Hypertension can be managed by making lifestyle changes and possibly by taking medicines. Your health care provider will help you make a plan to bring your blood pressure within a normal range. Eating and drinking  Eat a diet that is high in fiber and potassium, and low in salt (sodium), added sugar, and fat. An example eating plan is called the DASH (Dietary Approaches to Stop Hypertension) diet. To eat this way: ? Eat plenty of fresh fruits and vegetables. Try to fill half of your plate at each meal with fruits and vegetables. ? Eat whole grains, such as whole wheat pasta, brown rice, or whole grain bread. Fill about one quarter of your plate with whole grains. ? Eat low-fat diary products. ? Avoid fatty cuts of meat, processed or cured meats, and poultry with skin. Fill about one quarter of your plate with lean proteins such as fish, chicken without skin, beans, eggs, and tofu. ? Avoid premade and processed foods. These tend to be higher in sodium, added sugar, and fat.  Reduce your daily sodium intake. Most people with hypertension should eat less than 1,500 mg of sodium a day.  Limit alcohol intake  to no more than 1 drink a day for nonpregnant women and 2 drinks a day for men. One drink equals 12 oz of beer, 5 oz of wine, or 1 oz of hard liquor. Lifestyle  Work with your health care provider to maintain a healthy body weight, or to lose weight. Ask what an ideal weight is for you.  Get at least 30 minutes of exercise that causes your heart to beat faster (aerobic exercise) most days of the week. Activities may include walking, swimming, or biking.  Include exercise to strengthen your muscles (resistance exercise), such as weight lifting, as part of your weekly exercise routine. Try to do these types of exercises for  30 minutes at least 3 days a week.  Do not use any products that contain nicotine or tobacco, such as cigarettes and e-cigarettes. If you need help quitting, ask your health care provider.  Control any long-term (chronic) conditions you have, such as high cholesterol or diabetes. Monitoring  Monitor your blood pressure at home as told by your health care provider. Your personal target blood pressure may vary depending on your medical conditions, your age, and other factors.  Have your blood pressure checked regularly, as often as told by your health care provider. Working with your health care provider  Review all the medicines you take with your health care provider because there may be side effects or interactions.  Talk with your health care provider about your diet, exercise habits, and other lifestyle factors that may be contributing to hypertension.  Visit your health care provider regularly. Your health care provider can help you create and adjust your plan for managing hypertension. Will I need medicine to control my blood pressure? Your health care provider may prescribe medicine if lifestyle changes are not enough to get your blood pressure under control, and if:  Your systolic blood pressure is 130 or higher.  Your diastolic blood pressure is 80 or higher.  Take medicines only as told by your health care provider. Follow the directions carefully. Blood pressure medicines must be taken as prescribed. The medicine does not work as well when you skip doses. Skipping doses also puts you at risk for problems. Contact a health care provider if:  You think you are having a reaction to medicines you have taken.  You have repeated (recurrent) headaches.  You feel dizzy.  You have swelling in your ankles.  You have trouble with your vision. Get help right away if:  You develop a severe headache or confusion.  You have unusual weakness or numbness, or you feel faint.  You  have severe pain in your chest or abdomen.  You vomit repeatedly.  You have trouble breathing. Summary  Hypertension is when the force of blood pumping through your arteries is too strong. If this condition is not controlled, it may put you at risk for serious complications.  Your personal target blood pressure may vary depending on your medical conditions, your age, and other factors. For most people, a normal blood pressure is less than 120/80.  Hypertension is managed by lifestyle changes, medicines, or both. Lifestyle changes include weight loss, eating a healthy, low-sodium diet, exercising more, and limiting alcohol. This information is not intended to replace advice given to you by your health care provider. Make sure you discuss any questions you have with your health care provider. Document Released: 12/05/2011 Document Revised: 02/08/2016 Document Reviewed: 02/08/2016 Elsevier Interactive Patient Education  [employer].

## 2016-12-19 NOTE — Progress Notes (Signed)
Subjective:  By signing my name below, I, Alice Graham, attest that this documentation has been prepared under the direction and in the presence of Delman Cheadle, MD Electronically Signed: Ladene Artist, ED Scribe 12/19/2016 at 12:10 PM.   Patient ID: Alice Graham, female    DOB: Nov 08, 1955, 61 y.o.   MRN: [id number]  Chief Complaint  Patient presents with  . Medication Problem    thinks the BP medicaiton is causing her not to be able to sleep x2 weeks   . Medication Refill    Xanax   HPI Alice Graham is a 61 y.o. female who presents to Primary Care at [facility] to discuss medications. 1 month prior I suggested that pt increased losartan from 25 to 50 mg an continue monitoring BP at home. As pt restarted her Celebrex for her RA pain, BP increased to 130s-140s/90s-100s.   Today, pt reports fatigue and irregular heartbeats x 2 weeks. She reports that she initially had a flare-up of left shoulder pain which caused her to sleep on her right side but then she developed right hip pain, resulting in sleeping on the couch. States she could not get adequate sleep on the couch so she took Xanax for sleep and Tylenol and Flexeril for pain but still woke up after 3-4 hours. Pt reports irregular heartbeats; reports 3 beats, skip 1, 15 beats, skip 1. But states she stopped Losartan 4 days ago and has noticed irregular heartbeat has not been as severe. Reports home BP readings of 145/92 at home with Losartan and Celebrex.   Pt used to take plaquenil for RA but stopped the medication 5 years ago because she thought it was affecting her HAs. Denies occular HA since starting B12 injections. Pt's last eye exam was 4-5 years ago. Denies visual disturbances. She has an appointment scheduled with Dr. Rogers Blocker for 04/29/17.  Past Medical History:  Diagnosis Date  . Arthritis   . Hyperlipidemia   . Hypertension    Current Outpatient Prescriptions on File Prior to Visit  Medication Sig Dispense Refill  . Acetaminophen  (TYLENOL EXTRA STRENGTH PO) Take by mouth.    . ALPRAZolam (XANAX) 0.5 MG tablet SEE NOTES 30 tablet 0  . aspirin 81 MG tablet Take 81 mg by mouth daily.    Marland Kitchen atorvastatin (LIPITOR) 20 MG tablet TAKE 1 TABLET(20 MG) BY MOUTH DAILY 90 tablet 0  . celecoxib (CELEBREX) 100 MG capsule Take 1 capsule (100 mg total) by mouth 2 (two) times daily. 60 capsule 2  . Cyanocobalamin (VITAMIN B-12) 2500 MCG SUBL Place 2,500 mcg under the tongue daily.    . cyclobenzaprine (FLEXERIL) 10 MG tablet TAKE 1 TABLET(10 MG) BY MOUTH AT BEDTIME AS NEEDED FOR MUSCLE SPASMS 30 tablet 0  . Docusate Calcium (STOOL SOFTENER PO) Take by mouth.    . fluticasone (FLONASE) 50 MCG/ACT nasal spray Place 2 sprays into both nostrils daily. 16 g 6  . folic acid (FOLVITE) 1 MG tablet Take 1 tablet (1 mg total) by mouth daily. 90 tablet 3  . methotrexate (RHEUMATREX) 2.5 MG tablet Take 6 tablets (15 mg total) by mouth once a week. Caution:Chemotherapy. Protect from light. 84 tablet 1  . albuterol (PROVENTIL HFA;VENTOLIN HFA) 108 (90 Base) MCG/ACT inhaler Inhale 2 puffs into the lungs every 6 (six) hours as needed for wheezing or shortness of breath. (Patient not taking: Reported on 12/19/2016) 1 Inhaler 0   Current Facility-Administered Medications on File Prior to Visit  Medication Dose Route Frequency Provider Last  Rate Last Dose  . cyanocobalamin ((VITAMIN B-12)) injection 1,000 mcg  1,000 mcg Intramuscular Q30 days Shawnee Knapp, MD   1,000 mcg at 12/19/16 1150   No Known Allergies   Past Surgical History:  Procedure Laterality Date  . childbirth    . TUBAL LIGATION     Family History  Problem Relation Age of Onset  . Heart failure Unknown   . Stroke Unknown   . Diabetes Unknown   . COPD Unknown   . Diabetes Mother   . Heart disease Mother 54       MI that resulted in death  . Heart failure Mother   . Cancer Father 43       colon cancer  . Heart disease Father   . Hyperlipidemia Father   . Hypertension Father   .  Stroke Father    Social History   Social History  . Marital status: Single    Spouse name: N/A  . Number of children: 2  . Years of education: college    Occupational History  . Nurse- [employer]    Social History Main Topics  . Smoking status: Former Research scientist (life sciences)  . Smokeless tobacco: Never Used  . Alcohol use No  . Drug use: No  . Sexual activity: Not Asked   Other Topics Concern  . None   Social History Narrative   Lives with daughter   Caffeine use: 2 cups daily coffee or large sweet tea almost daily   Depression screen [facility] 2/9 12/19/2016 11/16/2016 10/17/2016 08/15/2016 07/26/2016  Decreased Interest 0 0 0 0 0  Down, Depressed, Hopeless 0 0 0 0 0  PHQ - 2 Score 0 0 0 0 0    Review of Systems  Constitutional: Positive for fatigue.  Eyes: Negative for visual disturbance.  Cardiovascular: Positive for palpitations.  Psychiatric/Behavioral: Positive for sleep disturbance.      Objective:   Physical Exam  Constitutional: She is oriented to person, place, and time. She appears well-developed and well-nourished. No distress.  HENT:  Head: Normocephalic and atraumatic.  Eyes: Conjunctivae and EOM are normal.  Neck: Neck supple. No tracheal deviation present.  Cardiovascular: Regular rhythm.  Tachycardia present.   Pulmonary/Chest: Effort normal and breath sounds normal. No respiratory distress.  Musculoskeletal: Normal range of motion.  Neurological: She is alert and oriented to person, place, and time.  Skin: Skin is warm and dry.  Psychiatric: She has a normal mood and affect. Her behavior is normal.  Nursing note and vitals reviewed.  BP 130/80   Pulse 95   Temp 98.1 F (36.7 C) (Oral)   Resp 18   Ht 5' 4.5" (1.638 m)   Wt 160 lb (72.6 kg)   SpO2 96%   BMI 27.04 kg/m     Dg Chest 2 View  Result Date: 12/19/2016 CLINICAL DATA:  Chronic fatigue, palpitations, essential hypertension, former smoker EXAM: CHEST  2 VIEW COMPARISON:   None FINDINGS: Normal heart size, mediastinal contours, and pulmonary vascularity. Minimal central peribronchial thickening. Lungs otherwise clear. No pulmonary infiltrate, pleural effusion or pneumothorax. Bones demineralized. IMPRESSION: Minimal bronchitic changes without infiltrate. Electronically Signed   By: Lavonia Dana M.D.   On: 12/19/2016 12:35   Assessment & Plan:   1. Palpitations   2. Essential hypertension   3. Rheumatoid arthritis involving multiple sites, unspecified rheumatoid factor presence ([place])   4. Screening for breast cancer   5. Chronic fatigue  6. Insomnia, unspecified type   7. High risk medication use   8. Vitamin B12 deficiency   9. Pernicious anemia   10. Prediabetes     Orders Placed This Encounter  Procedures  . MM DIGITAL SCREENING BILATERAL    Standing Status:   Future    Standing Expiration Date:   02/18/2018    Order Specific Question:   Reason for Exam (SYMPTOM  OR DIAGNOSIS REQUIRED)    Answer:   screening for breast cancer    Order Specific Question:   Preferred imaging location?    Answer:   [facility]  . DG Chest 2 View    Standing Status:   Future    Number of Occurrences:   1    Standing Expiration Date:   12/19/2017    Order Specific Question:   Reason for Exam (SYMPTOM  OR DIAGNOSIS REQUIRED)    Answer:   fatigue, palpitations    Order Specific Question:   Preferred imaging location?    Answer:   External  . CBC with Differential/Platelet  . Comprehensive metabolic panel  . Vitamin B12  . Methotrexate  . Hemoglobin A1c  . Ambulatory referral to Ophthalmology    Referral Priority:   Routine    Referral Type:   Consultation    Referral Reason:   Specialty Services Required    Requested Specialty:   Ophthalmology    Number of Visits Requested:   1  . EKG 12-Lead    Meds ordered this encounter  Medications  . ALPRAZolam (XANAX) 0.5 MG tablet    Sig: TAKE 1 TABLET BY MOUTH EVERY NIGHT AT BEDTIME AS NEEDED FOR ANXIETY.     Dispense:  30 tablet    Refill:  1  . hydroxychloroquine (PLAQUENIL) 200 MG tablet    Sig: Take 1 tablet (200 mg total) by mouth 2 (two) times daily.    Dispense:  180 tablet    Refill:  0  . amLODipine (NORVASC) 2.5 MG tablet    Sig: Take 1 tablet (2.5 mg total) by mouth daily.    Dispense:  30 tablet    Refill:  1  . cyclobenzaprine (FLEXERIL) 10 MG tablet    Sig: TAKE 1 TABLET(10 MG) BY MOUTH AT BEDTIME AS NEEDED FOR MUSCLE SPASMS    Dispense:  30 tablet    Refill:  2    I personally performed the services described in this documentation, which was scribed in my presence. The recorded information has been reviewed and considered, and addended by me as needed.   Delman Cheadle, M.D.  Primary Care at [facility] [address] [place], [place] [address] [phone] phone [phone] fax  12/22/16 10:15 AM

## 2016-12-20 LAB — COMPREHENSIVE METABOLIC PANEL
ALBUMIN: 4.4 g/dL (ref 3.6–4.8)
ALT: 13 IU/L (ref 0–32)
AST: 15 IU/L (ref 0–40)
Albumin/Globulin Ratio: 1.4 (ref 1.2–2.2)
Alkaline Phosphatase: 86 IU/L (ref 39–117)
BUN / CREAT RATIO: 20 (ref 12–28)
BUN: 9 mg/dL (ref 8–27)
Bilirubin Total: <0.2 mg/dL (ref 0.0–1.2)
CO2: 19 mmol/L — AB (ref 20–29)
CREATININE: 0.46 mg/dL — AB (ref 0.57–1.00)
Calcium: 9.1 mg/dL (ref 8.7–10.3)
Chloride: 98 mmol/L (ref 96–106)
GFR calc non Af Amer: 106 mL/min/{1.73_m2} (ref >59)
GFR, EST AFRICAN AMERICAN: 122 mL/min/{1.73_m2} (ref >59)
GLUCOSE: 98 mg/dL (ref 65–99)
Globulin, Total: 3.2 g/dL (ref 1.5–4.5)
Potassium: 4.5 mmol/L (ref 3.5–5.2)
Sodium: 139 mmol/L (ref 134–144)
TOTAL PROTEIN: 7.6 g/dL (ref 6.0–8.5)

## 2016-12-20 LAB — CBC WITH DIFFERENTIAL/PLATELET
BASOS ABS: 0 10*3/uL (ref 0.0–0.2)
Basos: 0 %
EOS (ABSOLUTE): 0.1 10*3/uL (ref 0.0–0.4)
Eos: 2 %
HEMOGLOBIN: 12.5 g/dL (ref 11.1–15.9)
Hematocrit: 37.6 % (ref 34.0–46.6)
IMMATURE GRANULOCYTES: 0 %
Immature Grans (Abs): 0 10*3/uL (ref 0.0–0.1)
LYMPHS: 34 %
Lymphocytes Absolute: 2.3 10*3/uL (ref 0.7–3.1)
MCH: 29 pg (ref 26.6–33.0)
MCHC: 33.2 g/dL (ref 31.5–35.7)
MCV: 87 fL (ref 79–97)
MONOCYTES: 8 %
Monocytes Absolute: 0.6 10*3/uL (ref 0.1–0.9)
Neutrophils Absolute: 3.8 10*3/uL (ref 1.4–7.0)
Neutrophils: 56 %
PLATELETS: 387 10*3/uL — AB (ref 150–379)
RBC: 4.31 x10E6/uL (ref 3.77–5.28)
RDW: 14.5 % (ref 12.3–15.4)
WBC: 6.8 10*3/uL (ref 3.4–10.8)

## 2016-12-20 LAB — HEMOGLOBIN A1C
ESTIMATED AVERAGE GLUCOSE: 128 mg/dL
HEMOGLOBIN A1C: 6.1 % — AB (ref 4.8–5.6)

## 2016-12-20 LAB — VITAMIN B12

## 2016-12-20 LAB — METHOTREXATE: Methotrexate Lvl: NOT DETECTED umol/L (ref 0.02–5.00)

## 2017-02-27 ENCOUNTER — Ambulatory Visit: Payer: BLUE CROSS/BLUE SHIELD | Admitting: Physician Assistant

## 2017-03-06 ENCOUNTER — Ambulatory Visit: Payer: BLUE CROSS/BLUE SHIELD | Admitting: Family Medicine

## 2017-03-07 ENCOUNTER — Ambulatory Visit: Payer: BLUE CROSS/BLUE SHIELD | Admitting: Family Medicine

## 2017-03-14 ENCOUNTER — Other Ambulatory Visit: Payer: Self-pay

## 2017-03-14 ENCOUNTER — Other Ambulatory Visit: Payer: Self-pay | Admitting: Family Medicine

## 2017-03-14 ENCOUNTER — Ambulatory Visit: Payer: BLUE CROSS/BLUE SHIELD | Admitting: Family Medicine

## 2017-03-14 ENCOUNTER — Encounter: Payer: Self-pay | Admitting: Family Medicine

## 2017-03-14 VITALS — BP 114/70 | HR 100 | Temp 98.2°F | Resp 16 | Ht 64.5 in | Wt 163.2 lb

## 2017-03-14 DIAGNOSIS — M069 Rheumatoid arthritis, unspecified: Secondary | ICD-10-CM

## 2017-03-14 DIAGNOSIS — Z5181 Encounter for therapeutic drug level monitoring: Secondary | ICD-10-CM | POA: Diagnosis not present

## 2017-03-14 DIAGNOSIS — D510 Vitamin B12 deficiency anemia due to intrinsic factor deficiency: Secondary | ICD-10-CM

## 2017-03-14 DIAGNOSIS — R002 Palpitations: Secondary | ICD-10-CM

## 2017-03-14 DIAGNOSIS — E785 Hyperlipidemia, unspecified: Secondary | ICD-10-CM

## 2017-03-14 DIAGNOSIS — E538 Deficiency of other specified B group vitamins: Secondary | ICD-10-CM

## 2017-03-14 DIAGNOSIS — I10 Essential (primary) hypertension: Secondary | ICD-10-CM | POA: Diagnosis not present

## 2017-03-14 MED ORDER — FLUTICASONE PROPIONATE 50 MCG/ACT NA SUSP: 2.0000 | Freq: Every day | NASAL | 6 refills | Status: AC

## 2017-03-14 MED ORDER — CYCLOBENZAPRINE HCL 10 MG PO TABS: ORAL_TABLET | ORAL | 2 refills | Status: DC

## 2017-03-14 MED ORDER — FOLIC ACID 1 MG PO TABS: 1.0000 mg | ORAL_TABLET | Freq: Every day | ORAL | 3 refills | Status: AC

## 2017-03-14 MED ORDER — ATORVASTATIN CALCIUM 20 MG PO TABS: ORAL_TABLET | ORAL | 1 refills | Status: DC

## 2017-03-14 MED ORDER — CYANOCOBALAMIN 1000 MCG/ML IJ SOLN
1000.0000 ug | INTRAMUSCULAR | Status: DC
  Administered 2017-03-14 – 2018-01-01 (×7): 1000 ug via INTRAMUSCULAR

## 2017-03-14 MED ORDER — METOPROLOL SUCCINATE ER 25 MG PO TB24: 25.0000 mg | ORAL_TABLET | Freq: Every day | ORAL | 1 refills | Status: DC

## 2017-03-14 MED ORDER — METHOTREXATE 2.5 MG PO TABS: 15.0000 mg | ORAL_TABLET | ORAL | 1 refills | Status: AC

## 2017-03-14 NOTE — Progress Notes (Signed)
Subjective:    Patient ID: Alice Graham, female    DOB: Aug 06, 1955, 61 y.o.   MRN: [id number] Chief Complaint  Patient presents with  . Follow-up  . Medication Refill    lipitor, flexeril, flonase, folvite, methotrexate    HPI  In the past month she has had palpitations wake her up from sleep and she wakes with bizzare dreams.   Would like to go back to cardiology to consider holter for palpaitions - wonders if it could be linked. Dr. Loreta Ave  130/92.  Hasn't been worked for past several months so got insurance.  New referral Dr.Wolfe New referral to [facility] now that insurance change  Was taking the ativan at bed time but stopped and now using muscle relaxer instead but can only get 5-6 hrs but now she is not that tired.   Palptations occur more often at night but do occurt during the day some as well. Occ dizzy lightheaded during palp but recuvers with deep breathing, no CP, no change in mild baseline pedal edema. Hasn't need to take the amldopine.  Past Medical History:  Diagnosis Date  . Arthritis   . Hyperlipidemia   . Hypertension    Past Surgical History:  Procedure Laterality Date  . childbirth    . TUBAL LIGATION     Current Outpatient Medications on File Prior to Visit  Medication Sig Dispense Refill  . Acetaminophen (TYLENOL EXTRA STRENGTH PO) Take by mouth.    Marland Kitchen albuterol (PROVENTIL HFA;VENTOLIN HFA) 108 (90 Base) MCG/ACT inhaler Inhale 2 puffs into the lungs every 6 (six) hours as needed for wheezing or shortness of breath. (Patient not taking: Reported on 12/19/2016) 1 Inhaler 0  . ALPRAZolam (XANAX) 0.5 MG tablet TAKE 1 TABLET BY MOUTH EVERY NIGHT AT BEDTIME AS NEEDED FOR ANXIETY. 30 tablet 1  . aspirin 81 MG tablet Take 81 mg by mouth daily.    . Cyanocobalamin (VITAMIN B-12) 2500 MCG SUBL Place 2,500 mcg under the tongue daily.    Mariane Baumgarten Calcium (STOOL SOFTENER PO) Take by mouth.     No current facility-administered medications on file prior to visit.     No Known Allergies Family History  Problem Relation Age of Onset  . Heart failure Unknown   . Stroke Unknown   . Diabetes Unknown   . COPD Unknown   . Diabetes Mother   . Heart disease Mother 47       MI that resulted in death  . Heart failure Mother   . Cancer Father 26       colon cancer  . Heart disease Father   . Hyperlipidemia Father   . Hypertension Father   . Stroke Father    Social History   Socioeconomic History  . Marital status: Single    Spouse name: None  . Number of children: 2  . Years of education: college   . Highest education level: None  Social Needs  . Financial resource strain: None  . Food insecurity - worry: None  . Food insecurity - inability: None  . Transportation needs - medical: None  . Transportation needs - non-medical: None  Occupational History  . Occupation: Marine scientist- Nurse, learning disability    Employer: [employer]     Comment: [employer]   Tobacco Use  . Smoking status: Former Research scientist (life sciences)  . Smokeless tobacco: Never Used  Substance and Sexual Activity  . Alcohol use: No  . Drug use: No  . Sexual activity: None  Other  Topics Concern  . None  Social History Narrative   Lives with daughter   Caffeine use: 2 cups daily coffee or large sweet tea almost daily   Depression screen [facility] 2/9 03/14/2017 12/19/2016 11/16/2016 10/17/2016 08/15/2016  Decreased Interest 0 0 0 0 0  Down, Depressed, Hopeless 0 0 0 0 0  PHQ - 2 Score 0 0 0 0 0     Review of Systems See hpi    Objective:   Physical Exam  Constitutional: She is oriented to person, place, and time. She appears well-developed and well-nourished. No distress.  HENT:  Head: Normocephalic and atraumatic.  Right Ear: External ear normal.  Left Ear: External ear normal.  Eyes: Conjunctivae are normal. No scleral icterus.  Neck: Normal range of motion. Neck supple. No thyromegaly present.  Cardiovascular: Normal rate, regular rhythm, normal heart sounds and intact distal pulses.    Pulmonary/Chest: Effort normal and breath sounds normal. No respiratory distress.  Musculoskeletal: She exhibits no edema.  Lymphadenopathy:    She has no cervical adenopathy.  Neurological: She is alert and oriented to person, place, and time.  Skin: Skin is warm and dry. She is not diaphoretic. No erythema.  Psychiatric: She has a normal mood and affect. Her behavior is normal.      BP 114/70   Pulse 100   Temp 98.2 F (36.8 C)   Resp 16   Ht 5' 4.5" (1.638 m)   Wt 163 lb 3.2 oz (74 kg)   SpO2 96%   BMI 27.58 kg/m      Assessment & Plan:   1. Pernicious anemia   2. Hyperlipidemia, unspecified hyperlipidemia type   3. Rheumatoid arthritis involving multiple sites, unspecified rheumatoid factor presence ([place])   4. Vitamin B12 deficiency   5. Essential hypertension   6. Medication monitoring encounter   7. Palpitations     Orders Placed This Encounter  Procedures  . Comprehensive metabolic panel  . CBC with Differential/Platelet  . Ambulatory referral to Cardiology    Referral Priority:   Routine    Referral Type:   Consultation    Referral Reason:   Specialty Services Required    Referred to Provider:   Sueanne Margarita, MD    Requested Specialty:   Cardiology    Number of Visits Requested:   1  . Ambulatory referral to Rheumatology    Referral Priority:   Routine    Referral Type:   Consultation    Referral Reason:   Specialty Services Required    Requested Specialty:   Rheumatology    Number of Visits Requested:   1  . Ambulatory referral to Ophthalmology    Referral Priority:   Routine    Referral Type:   Consultation    Referral Reason:   Specialty Services Required    Requested Specialty:   Ophthalmology    Number of Visits Requested:   1    Meds ordered this encounter  Medications  . cyanocobalamin ((VITAMIN B-12)) injection 1,000 mcg  . metoprolol succinate (TOPROL-XL) 25 MG 24 hr tablet    Sig: Take 1 tablet (25 mg total) by mouth daily.     Dispense:  90 tablet    Refill:  1  . methotrexate (RHEUMATREX) 2.5 MG tablet    Sig: Take 6 tablets (15 mg total) by mouth once a week. Caution:Chemotherapy. Protect from light.    Dispense:  84 tablet    Refill:  1  . folic acid (FOLVITE) 1  MG tablet    Sig: Take 1 tablet (1 mg total) by mouth daily.    Dispense:  90 tablet    Refill:  3  . fluticasone (FLONASE) 50 MCG/ACT nasal spray    Sig: Place 2 sprays into both nostrils daily.    Dispense:  16 g    Refill:  6  . cyclobenzaprine (FLEXERIL) 10 MG tablet    Sig: TAKE 1 TABLET(10 MG) BY MOUTH AT BEDTIME AS NEEDED FOR MUSCLE SPASMS    Dispense:  30 tablet    Refill:  2  . atorvastatin (LIPITOR) 20 MG tablet    Sig: TAKE 1 TABLET(20 MG) BY MOUTH DAILY    Dispense:  90 tablet    Refill:  1     Delman Cheadle, M.D.  Primary Care at [facility] [address] [place],  [address] [phone] phone [phone] fax  03/17/17 6:37 AM

## 2017-03-14 NOTE — Patient Instructions (Addendum)
RTC in 2-3 months for FASTING labs.  We referred you to Dr. Fransico Him with [facility]  And ophthamology Dr. Margot Ables [phone]     IF you received an x-ray today, you will receive an invoice from [facility]. Please contact [facility] at [phone] with questions or concerns regarding your invoice.   IF you received labwork today, you will receive an invoice from [facility]. Please contact [facility] at [phone] with questions or concerns regarding your invoice.   Our billing staff will not be able to assist you with questions regarding bills from these companies.  You will be contacted with the lab results as soon as they are available. The fastest way to get your results is to activate your My Chart account. Instructions are located on the last page of this paperwork. If you have not heard from [place] regarding the results in 2 weeks, please contact this office.      Palpitations A palpitation is the feeling that your heartbeat is irregular or is faster than normal. It may feel like your heart is fluttering or skipping a beat. Palpitations are usually not a serious problem. They may be caused by many things, including smoking, caffeine, alcohol, stress, and certain medicines. Although most causes of palpitations are not serious, palpitations can be a sign of a serious medical problem. In some cases, you may need further medical evaluation. Follow these instructions at home: Pay attention to any changes in your symptoms. Take these actions to help with your condition:  Avoid the following: ? Caffeinated coffee, tea, soft drinks, diet pills, and energy drinks. ? Chocolate. ? Alcohol.  Do not use any tobacco products, such as cigarettes, chewing tobacco, and e-cigarettes. If you need help quitting, ask your health care provider.  Try to reduce your stress and anxiety. Things that can help you relax  include: ? Yoga. ? Meditation. ? Physical activity, such as swimming, jogging, or walking. ? Biofeedback. This is a method that helps you learn to use your mind to control things in your body, such as your heartbeats.  Get plenty of rest and sleep.  Take over-the-counter and prescription medicines only as told by your health care provider.  Keep all follow-up visits as told by your health care provider. This is important.  Contact a health care provider if:  You continue to have a fast or irregular heartbeat after 24 hours.  Your palpitations occur more often. Get help right away if:  You have chest pain or shortness of breath.  You have a severe headache.  You feel dizzy or you faint. This information is not intended to replace advice given to you by your health care provider. Make sure you discuss any questions you have with your health care provider. Document Released: 03/09/2000 Document Revised: 08/15/2015 Document Reviewed: 11/25/2014 Elsevier Interactive Patient Education  2018 [employer].  Vitamin B12 Deficiency Vitamin B12 deficiency occurs when the body does not have enough vitamin B12. Vitamin B12 is an important vitamin. The body needs vitamin B12:  To make red blood cells.  To make DNA. This is the genetic material inside cells.  To help the nerves work properly so they can carry messages from the brain to the body.  Vitamin B12 deficiency can cause various health problems, such as a low red blood cell count (anemia) or nerve damage. What are the causes? This condition may be caused by:  Not eating enough foods that contain vitamin B12.  Not having  enough stomach acid and digestive fluids to properly absorb vitamin B12 from the food that you eat.  Certain digestive system diseases that make it hard to absorb vitamin B12. These diseases include Crohn disease, chronic pancreatitis, and cystic fibrosis.  Pernicious anemia. This is a condition in which the  body does not make enough of a protein (intrinsic factor), resulting in too few red blood cells.  Having a surgery in which part of the stomach or small intestine is removed.  Taking certain medicines that make it hard for the body to absorb vitamin B12. These medicines include: ? Heartburn medicine (antacids and proton pump inhibitors). ? An antibiotic medicine called neomycin. ? Some medicines that are used to treat diabetes, tuberculosis, gout, or high cholesterol.  What increases the risk? The following factors may make you more likely to develop a B12 deficiency:  Being older than age 58.  Eating a vegetarian or vegan diet, especially while you are pregnant.  Eating a poor diet while you are pregnant.  Taking certain drugs.  Having alcoholism.  What are the signs or symptoms? In some cases, there are no symptoms of this condition. If the condition leads to anemia or nerve damage, various symptoms can occur, such as:  Weakness.  Fatigue.  Loss of appetite.  Weight loss.  Numbness or tingling in your hands and feet.  Redness and burning of the tongue.  Confusion or memory problems.  Depression.  Sensory problems, such as color blindness, ringing in the ears, or loss of taste.  Diarrhea or constipation.  Trouble walking.  If anemia is severe, symptoms can include:  Shortness of breath.  Dizziness.  Rapid heart rate (tachycardia).  How is this diagnosed? This condition may be diagnosed with a blood test to measure the level of vitamin B12 in your blood. You may have other tests to help find the cause of your vitamin B12 deficiency. These tests may include:  A complete blood count (CBC). This is a group of tests that measure certain characteristics of blood cells.  A blood test to measure intrinsic factor.  An endoscopy. In this procedure, a thin tube with a camera on the end is used to look into your stomach or intestines.  How is this  treated? Treatment for this condition depends on the cause. Common treatment options include:  Changing your eating and drinking habits, such as: ? Eating more foods that contain vitamin B12. ? Drinking less alcohol or no alcohol.  Taking vitamin B12 supplements. Your health care provider will tell you which dosage is best for you.  Getting vitamin B12 injections.  Follow these instructions at home:  Take supplements only as told by your health care provider. Follow the directions carefully.  Get any injections that are prescribed by your health care provider.  Do not miss your appointments.  Eat lots of healthy foods that contain vitamin B12. Ask your health care provider if you should work with a dietitian. Foods that contain vitamin B12 include: ? Meat. ? Meat from birds (poultry). ? Fish. ? Eggs. ? Cereal and dairy products that are fortified. This means that vitamin B12 has been added to the food. Check the label on the package to see if the food is fortified.  Do not abuse alcohol.  Keep all follow-up visits as told by your health care provider. This is important. Contact a health care provider if:  Your symptoms come back. Get help right away if:  You develop shortness of breath.  You have chest pain.  You become dizzy or you lose consciousness. This information is not intended to replace advice given to you by your health care provider. Make sure you discuss any questions you have with your health care provider. Document Released: 06/04/2011 Document Revised: 08/24/2015 Document Reviewed: 07/28/2014 Elsevier Interactive Patient Education  2018 [employer].

## 2017-03-14 NOTE — Telephone Encounter (Signed)
Controlled substance and celebrex

## 2017-03-15 LAB — CBC WITH DIFFERENTIAL/PLATELET
BASOS: 0 %
Basophils Absolute: 0 10*3/uL (ref 0.0–0.2)
EOS (ABSOLUTE): 0.1 10*3/uL (ref 0.0–0.4)
Eos: 2 %
HEMOGLOBIN: 11.7 g/dL (ref 11.1–15.9)
Hematocrit: 36 % (ref 34.0–46.6)
IMMATURE GRANS (ABS): 0 10*3/uL (ref 0.0–0.1)
Immature Granulocytes: 0 %
LYMPHS ABS: 2.6 10*3/uL (ref 0.7–3.1)
LYMPHS: 36 %
MCH: 28.6 pg (ref 26.6–33.0)
MCHC: 32.5 g/dL (ref 31.5–35.7)
MCV: 88 fL (ref 79–97)
Monocytes Absolute: 0.5 10*3/uL (ref 0.1–0.9)
Monocytes: 7 %
NEUTROS ABS: 3.9 10*3/uL (ref 1.4–7.0)
Neutrophils: 55 %
PLATELETS: 365 10*3/uL (ref 150–379)
RBC: 4.09 x10E6/uL (ref 3.77–5.28)
RDW: 14.6 % (ref 12.3–15.4)
WBC: 7.2 10*3/uL (ref 3.4–10.8)

## 2017-03-15 LAB — COMPREHENSIVE METABOLIC PANEL
A/G RATIO: 1.5 (ref 1.2–2.2)
ALBUMIN: 4 g/dL (ref 3.6–4.8)
ALT: 13 IU/L (ref 0–32)
AST: 14 IU/L (ref 0–40)
Alkaline Phosphatase: 90 IU/L (ref 39–117)
BUN / CREAT RATIO: 17 (ref 12–28)
BUN: 10 mg/dL (ref 8–27)
Bilirubin Total: <0.2 mg/dL (ref 0.0–1.2)
CALCIUM: 8.8 mg/dL (ref 8.7–10.3)
CO2: 21 mmol/L (ref 20–29)
Chloride: 101 mmol/L (ref 96–106)
Creatinine, Ser: 0.59 mg/dL (ref 0.57–1.00)
GFR, EST AFRICAN AMERICAN: 113 mL/min/{1.73_m2} (ref >59)
GFR, EST NON AFRICAN AMERICAN: 98 mL/min/{1.73_m2} (ref >59)
GLOBULIN, TOTAL: 2.7 g/dL (ref 1.5–4.5)
Glucose: 114 mg/dL — ABNORMAL HIGH (ref 65–99)
POTASSIUM: 4.2 mmol/L (ref 3.5–5.2)
SODIUM: 139 mmol/L (ref 134–144)
TOTAL PROTEIN: 6.7 g/dL (ref 6.0–8.5)

## 2017-03-18 NOTE — Telephone Encounter (Signed)
Is it ok to fill?

## 2017-03-19 MED ORDER — CELECOXIB 100 MG PO CAPS: ORAL_CAPSULE | ORAL | 2 refills | Status: DC

## 2017-03-19 NOTE — Telephone Encounter (Signed)
Looks like RN already sent in the celebrex refill on 12/21 for pt but note state that there is a controlled substance refill reqeust as well??? What is it?  Does pt need a refill on the alprazolam? (I had thought she said at her visit 12/20 that she wasn't using that/needing it anymore but not on any other controlled meds.

## 2017-03-24 ENCOUNTER — Encounter: Payer: Self-pay | Admitting: Family Medicine

## 2017-04-11 ENCOUNTER — Ambulatory Visit (INDEPENDENT_AMBULATORY_CARE_PROVIDER_SITE_OTHER): Payer: BLUE CROSS/BLUE SHIELD | Admitting: Urgent Care

## 2017-04-11 DIAGNOSIS — E539 Vitamin B deficiency, unspecified: Secondary | ICD-10-CM

## 2017-04-11 NOTE — Progress Notes (Signed)
Pt received B12 injection

## 2017-04-11 NOTE — Patient Instructions (Signed)
Please follow-up in 30 days for the next B12 injection.

## 2017-04-19 ENCOUNTER — Telehealth: Payer: Self-pay | Admitting: Family Medicine

## 2017-04-19 DIAGNOSIS — M51369 Other intervertebral disc degeneration, lumbar region without mention of lumbar back pain or lower extremity pain: Secondary | ICD-10-CM

## 2017-04-19 DIAGNOSIS — M545 Low back pain: Secondary | ICD-10-CM

## 2017-04-19 DIAGNOSIS — M4126 Other idiopathic scoliosis, lumbar region: Secondary | ICD-10-CM

## 2017-04-19 DIAGNOSIS — M4310 Spondylolisthesis, site unspecified: Secondary | ICD-10-CM

## 2017-04-19 DIAGNOSIS — M5136 Other intervertebral disc degeneration, lumbar region: Secondary | ICD-10-CM

## 2017-04-19 NOTE — Telephone Encounter (Signed)
Copied from [place]. Topic: Referral - Request >> Apr 19, 2017 11:32 AM Yvette Rack wrote: Reason for CRM: patient calling stating that she would like a referral for her low back pain  -----------------------------------------------------------------------------------------  Please advise. Thanks!

## 2017-04-22 ENCOUNTER — Telehealth: Payer: Self-pay

## 2017-04-22 ENCOUNTER — Ambulatory Visit: Payer: BLUE CROSS/BLUE SHIELD | Admitting: Family Medicine

## 2017-04-22 MED ORDER — HYDROCODONE-ACETAMINOPHEN 5-325 MG PO TABS: 1.0000 | ORAL_TABLET | Freq: Four times a day (QID) | ORAL | 0 refills | Status: DC | PRN

## 2017-04-22 NOTE — Telephone Encounter (Signed)
Phone call to patient to gather more information. Alice Graham has an appointment at 1200 with Dr. Brigitte Pulse. She is calling in regards to lower back pain that is making it difficult to walk.  Onset: x6 sdays Location: Lower back, middle of left buttock Duration: Only while walking, No pain while laying flat. Character: Grabbing pain, rates as a 0 currently but while ambulating is a 10 Aggravating: Sitting up and walking Relieved by: Tylenol, muscle relaxer, heat- but states this only impacts discomfort a little. Walking is still excruciating.  She states, "I can go to the bathroom, but it takes every ounce of strength. It's gradually gotten worse." Denies fever. Can still urinate and defecate.   Patient wants to know if Dr. Brigitte Pulse can prescribe her something for pain so she can make to her appointment at noon.   Pharmacy confirmed with patient.   Provider, please advise.

## 2017-04-22 NOTE — Telephone Encounter (Signed)
Phone call to patient. RN notes patient missed appointment today at noon with Dr. Brigitte Pulse. Patient states she took 1 Vicodin 5/325, pain went from an 10 to an 8.   Provider, do you want her to reschedule appointment for this week or seek care in ED due to pain level?

## 2017-04-22 NOTE — Telephone Encounter (Signed)
Referral placed.

## 2017-04-22 NOTE — Telephone Encounter (Signed)
Copied from [place]. Topic: Inquiry >> Apr 22, 2017  9:12 AM Alice Graham wrote: Reason for CRM: Patient called stating that she cannot make it to her appt. Patient stated that she cannot move and that she has not been able to shower inn a week. Patient wants to know if Dr. Brigitte Pulse would prescribe something to help her move so that she can get to her appt. Patient states that she will have her daughter to pick up any medication that Dr. Brigitte Pulse prescribes. Please call this patient ASAP at [phone].        Thank You!!!

## 2017-04-22 NOTE — Telephone Encounter (Signed)
Pt notified and verbalized understanding.

## 2017-04-22 NOTE — Telephone Encounter (Signed)
Ok to try taking 2 vicodin but someone else needs to drive her. If patient can make it to our office, than recommend coming here. If she is hurting to much to come to an appointment here, than I guess she needs to go to an UC or the ED and if she hurts to much to get there, then she needs to call 911 for transport to ED. If she is having significant weakness or numbness in a leg or loss of bowel or bladder function or fevers/chills, than definitely needs to go to ED.  I have placed referral to ortho spine but it usually takes several weeks to get an appointment.

## 2017-04-22 NOTE — Telephone Encounter (Signed)
Few hydrocodone sent to pharm

## 2017-04-23 ENCOUNTER — Ambulatory Visit (INDEPENDENT_AMBULATORY_CARE_PROVIDER_SITE_OTHER): Payer: BLUE CROSS/BLUE SHIELD

## 2017-04-23 ENCOUNTER — Ambulatory Visit (INDEPENDENT_AMBULATORY_CARE_PROVIDER_SITE_OTHER): Payer: BLUE CROSS/BLUE SHIELD | Admitting: Family Medicine

## 2017-04-23 ENCOUNTER — Encounter: Payer: Self-pay | Admitting: Family Medicine

## 2017-04-23 VITALS — BP 144/86 | HR 88 | Temp 97.4°F | Resp 18 | Ht 64.5 in | Wt 158.0 lb

## 2017-04-23 DIAGNOSIS — M5442 Lumbago with sciatica, left side: Secondary | ICD-10-CM

## 2017-04-23 DIAGNOSIS — M5441 Lumbago with sciatica, right side: Secondary | ICD-10-CM

## 2017-04-23 MED ORDER — PREDNISONE 20 MG PO TABS: ORAL_TABLET | ORAL | 0 refills | Status: DC

## 2017-04-23 MED ORDER — OXYCODONE-ACETAMINOPHEN 5-325 MG PO TABS: 2.0000 | ORAL_TABLET | Freq: Four times a day (QID) | ORAL | 0 refills | Status: DC | PRN

## 2017-04-23 NOTE — Progress Notes (Addendum)
Subjective:  By signing my name below, I, Alice Graham, attest that this documentation has been prepared under the direction and in the presence of Alice Cheadle, MD Electronically Signed: Ladene Artist, ED Scribe 04/23/2017 at 4:04 PM.   Patient ID: Alice Graham, female    DOB: May 16, 1955, 62 y.o.   MRN: [id number]  Chief Complaint  Patient presents with  . Back Pain   HPI Alice Graham is a 62 y.o. female who presents to Primary Care at [facility] complaining of low back pain/L glute pain x 1 wk. No known fall or injury. Pt does recall getting in/out of her daughter's volkswagen which she suspects may have brought on her symptoms. She reports soreness and aching pain that radiates into her legs and stops at the bottom of the calves, L worse than R. Pt also reports increased pain with standing from a seated position. She was taking Flexeril bid and extra strength Tylenol which seemed to take the edge off. She took [place] yesterday with some relief, currently has 6 left. Denies weakness in LE, numbness/tingling, changes in bowel/bladder.  Past Medical History:  Diagnosis Date  . Arthritis   . Hyperlipidemia   . Hypertension    Current Outpatient Medications on File Prior to Visit  Medication Sig Dispense Refill  . Acetaminophen (TYLENOL EXTRA STRENGTH PO) Take by mouth.    . ALPRAZolam (XANAX) 0.5 MG tablet TAKE 1 TABLET BY MOUTH EVERY NIGHT AT BEDTIME AS NEEDED FOR ANXIETY. 30 tablet 1  . aspirin 81 MG tablet Take 81 mg by mouth daily.    Marland Kitchen atorvastatin (LIPITOR) 20 MG tablet TAKE 1 TABLET(20 MG) BY MOUTH DAILY 90 tablet 1  . celecoxib (CELEBREX) 100 MG capsule TAKE 1 CAPSULE(100 MG) BY MOUTH TWICE DAILY 60 capsule 2  . Cyanocobalamin (VITAMIN B-12) 2500 MCG SUBL Place 2,500 mcg under the tongue daily.    . cyclobenzaprine (FLEXERIL) 10 MG tablet TAKE 1 TABLET(10 MG) BY MOUTH AT BEDTIME AS NEEDED FOR MUSCLE SPASMS 30 tablet 2  . Docusate Calcium (STOOL SOFTENER PO) Take by mouth.    . fluticasone  (FLONASE) 50 MCG/ACT nasal spray Place 2 sprays into both nostrils daily. 16 g 6  . folic acid (FOLVITE) 1 MG tablet Take 1 tablet (1 mg total) by mouth daily. 90 tablet 3  . HYDROcodone-acetaminophen (NORCO/VICODIN) 5-325 MG tablet Take 1 tablet by mouth every 6 (six) hours as needed for moderate pain. 12 tablet 0  . methotrexate (RHEUMATREX) 2.5 MG tablet Take 6 tablets (15 mg total) by mouth once a week. Caution:Chemotherapy. Protect from light. 84 tablet 1  . metoprolol succinate (TOPROL-XL) 25 MG 24 hr tablet Take 1 tablet (25 mg total) by mouth daily. 90 tablet 1  . albuterol (PROVENTIL HFA;VENTOLIN HFA) 108 (90 Base) MCG/ACT inhaler Inhale 2 puffs into the lungs every 6 (six) hours as needed for wheezing or shortness of breath. (Patient not taking: Reported on 04/23/2017) 1 Inhaler 0   Current Facility-Administered Medications on File Prior to Visit  Medication Dose Route Frequency Provider Last Rate Last Dose  . cyanocobalamin ((VITAMIN B-12)) injection 1,000 mcg  1,000 mcg Intramuscular Q30 days Shawnee Knapp, MD   1,000 mcg at 03/14/17 1611   No Known Allergies   Past Surgical History:  Procedure Laterality Date  . childbirth    . TUBAL LIGATION     Family History  Problem Relation Age of Onset  . Heart failure Unknown   . Stroke Unknown   . Diabetes Unknown   .  COPD Unknown   . Diabetes Mother   . Heart disease Mother 10       MI that resulted in death  . Heart failure Mother   . Cancer Father 103       colon cancer  . Heart disease Father   . Hyperlipidemia Father   . Hypertension Father   . Stroke Father    Social History   Socioeconomic History  . Marital status: Single    Spouse name: None  . Number of children: 2  . Years of education: college   . Highest education level: None  Social Needs  . Financial resource strain: None  . Food insecurity - worry: None  . Food insecurity - inability: None  . Transportation needs - medical: None  . Transportation needs  - non-medical: None  Occupational History  . Occupation: Marine scientist- Nurse, learning disability    Employer: [employer]     Comment: [employer]   Tobacco Use  . Smoking status: Former Research scientist (life sciences)  . Smokeless tobacco: Never Used  Substance and Sexual Activity  . Alcohol use: No  . Drug use: No  . Sexual activity: None  Other Topics Concern  . None  Social History Narrative   Lives with daughter   Caffeine use: 2 cups daily coffee or large sweet tea almost daily   Depression screen [facility] 2/9 04/23/2017 03/14/2017 12/19/2016 11/16/2016 10/17/2016  Decreased Interest 0 0 0 0 0  Down, Depressed, Hopeless 0 0 0 0 0  PHQ - 2 Score 0 0 0 0 0    Review of Systems  Constitutional: Positive for activity change and appetite change. Negative for chills, diaphoresis and fever.  Gastrointestinal: Negative for abdominal pain, constipation and diarrhea.  Genitourinary: Negative for difficulty urinating, enuresis, pelvic pain and urgency.  Musculoskeletal: Positive for arthralgias, back pain, gait problem and myalgias.  Skin: Negative for rash.  Neurological: Negative for weakness and numbness.  Hematological: Does not bruise/bleed easily.      Objective:   Physical Exam  Constitutional: She is oriented to person, place, and time. She appears well-developed and well-nourished. No distress.  HENT:  Head: Normocephalic and atraumatic.  Eyes: Conjunctivae and EOM are normal.  Neck: Neck supple. No tracheal deviation present.  Cardiovascular: Normal rate.  Pulmonary/Chest: Effort normal. No respiratory distress.  Musculoskeletal:       Lumbar back: She exhibits decreased range of motion and spasm. She exhibits no bony tenderness.  Active Lumbar ROM: Moderate restriction on extension. Mild on lateral rotation. Flexion to 90 degrees. Negative straight leg raise bilaterally. Strength 5/5 throughout LLExt, 4+/5 in RLExt. No point tenderness to palpation over lumbar spine. +Tenderness Lt>Rt over SI joint.     Neurological: She is alert and oriented to person, place, and time.  Skin: Skin is warm and dry.  Psychiatric: She has a normal mood and affect. Her behavior is normal.  Nursing note and vitals reviewed.  Vitals:   04/23/17 1601  BP: (!) 144/86  Pulse: 88  Resp: 18  Temp: (!) 97.4 F (36.3 C)  TempSrc: Oral  SpO2: 97%  Weight: 158 lb (71.7 kg)  Height: 5' 4.5" (1.638 m)   Dg Lumbar Spine 2-3 Views  Result Date: 04/23/2017 CLINICAL DATA:  Severe low back pain, gluteal pain EXAM: LUMBAR SPINE - 2-3 VIEW COMPARISON:  None. FINDINGS: Leftward scoliosis in the mid lumbar spine. Degenerative disc disease changes throughout the lumbar spine, most pronounced at L2-3 and L5-S1. Degenerative facet disease at L4-5 and  L5-S1. No subluxation or fracture. Aortic and iliac calcifications. No aneurysm or adenopathy. IMPRESSION: Leftward scoliosis. Degenerative disc and facet disease. No acute bony abnormality. Aortic atherosclerosis. Electronically Signed   By: Rolm Baptise M.D.   On: 04/23/2017 16:29  Dg Si Joints  Result Date: 04/23/2017 CLINICAL DATA:  Sudden onset severe back and gluteal pain. EXAM: BILATERAL SACROILIAC JOINTS - 3+ VIEW COMPARISON:  No recent prior. FINDINGS: Degenerative changes lumbar spine, SI joints, and both hips. Diffuse osteopenia. No evidence of fracture dislocation. Pelvic calcifications consistent phleboliths. Aortoiliac atherosclerotic vascular calcification. IMPRESSION: 1. Degenerative changes lumbar spine, SI joints, both hips. Diffuse osteopenia. No acute bony abnormality. 2.  Aortoiliac atherosclerotic vascular calcification. Electronically Signed   By: Radium   On: 04/23/2017 16:30    Assessment & Plan:   1. Acute bilateral low back pain with bilateral sciatica   Referral to spine and scoliosis was placed yesterday. Restart flexeril qhs. Start prednisone taper. Topical heat qid. On celebrex at baseline and will cont. Start trying to move more -> walk. No  lifting, gentle/slow bending. RTC immed if worsening.  Orders Placed This Encounter  Procedures  . DG Lumbar Spine 2-3 Views    Standing Status:   Future    Number of Occurrences:   1    Standing Expiration Date:   04/23/2018    Order Specific Question:   Reason for Exam (SYMPTOM  OR DIAGNOSIS REQUIRED)    Answer:   sudden onset severe low back and gluteal pain L>R    Order Specific Question:   Preferred imaging location?    Answer:   External  . DG Si Joints    Standing Status:   Future    Number of Occurrences:   1    Standing Expiration Date:   04/23/2018    Order Specific Question:   Reason for Exam (SYMPTOM  OR DIAGNOSIS REQUIRED)    Answer:   sudden onset severe low back and gluteal pain L>R    Order Specific Question:   Preferred imaging location?    Answer:   External    Meds ordered this encounter  Medications  . oxyCODONE-acetaminophen (PERCOCET/ROXICET) 5-325 MG tablet    Sig: Take 2 tablets by mouth every 6 (six) hours as needed for severe pain.    Dispense:  40 tablet    Refill:  0  . predniSONE (DELTASONE) 20 MG tablet    Sig: Take 3 tabs qd x 3d, then 2 tabs qd x 3d then 1 tab qd x 3d.    Dispense:  18 tablet    Refill:  0    I personally performed the services described in this documentation, which was scribed in my presence. The recorded information has been reviewed and considered, and addended by me as needed.   Alice Graham, M.D.  Primary Care at [facility] [address] [place], [place] [address] [phone] phone [phone] fax  04/23/17 4:41 PM

## 2017-04-23 NOTE — Patient Instructions (Addendum)
IF you received an x-ray today, you will receive an invoice from [facility]. Please contact [facility] at [phone] with questions or concerns regarding your invoice.   IF you received labwork today, you will receive an invoice from [facility]. Please contact [facility] at [phone] with questions or concerns regarding your invoice.   Our billing staff will not be able to assist you with questions regarding bills from these companies.  You will be contacted with the lab results as soon as they are available. The fastest way to get your results is to activate your My Chart account. Instructions are located on the last page of this paperwork. If you have not heard from [place] regarding the results in 2 weeks, please contact this office.     Low Back Strain A strain is a stretch or tear in a muscle or the strong cords of tissue that attach muscle to bone (tendons). Strains of the lower back (lumbar spine) are a common cause of low back pain. A strain occurs when muscles or tendons are torn or are stretched beyond their limits. The muscles may become inflamed, resulting in pain and sudden muscle tightening (spasms). A strain can happen suddenly due to an injury (trauma), or it can develop gradually due to overuse. There are three types of strains:  Grade 1 is a mild strain involving a minor tear of the muscle fibers or tendons. This may cause some pain but no loss of muscle strength.  Grade 2 is a moderate strain involving a partial tear of the muscle fibers or tendons. This causes more severe pain and some loss of muscle strength.  Grade 3 is a severe strain involving a complete tear of the muscle or tendon. This causes severe pain and complete or nearly complete loss of muscle strength.  What are the causes? This condition may be caused by:  Trauma, such as a fall or a hit to the body.  Twisting or overstretching the back. This may result from doing activities that  require a lot of energy, such as lifting heavy objects.  What increases the risk? The following factors may increase your risk of getting this condition:  Playing contact sports.  Participating in sports or activities that put excessive stress on the back and require a lot of bending and twisting, including: ? Lifting weights or heavy objects. ? Gymnastics. ? Soccer. ? Figure skating. ? Snowboarding.  Being overweight or obese.  Having poor strength and flexibility.  What are the signs or symptoms? Symptoms of this condition may include:  Sharp or dull pain in the lower back that does not go away. Pain may extend to the buttocks.  Stiffness.  Limited range of motion.  Inability to stand up straight due to stiffness or pain.  Muscle spasms.  How is this diagnosed? This condition may be diagnosed based on:  Your symptoms.  Your medical history.  A physical exam. ? Your health care provider may push on certain areas of your back to determine the source of your pain. ? You may be asked to bend forward, backward, and side to side to assess the severity of your pain and your range of motion.  Imaging tests, such as: ? X-rays. ? MRI.  How is this treated? Treatment for this condition may include:  Applying heat and cold to the affected area.  Medicines to help relieve pain and to relax your muscles (muscle relaxants).  NSAIDs to help reduce swelling and discomfort.  Physical therapy.  When your symptoms improve, it is important to gradually return to your normal routine as soon as possible to reduce pain, avoid stiffness, and avoid loss of muscle strength. Generally, symptoms should improve within 6 weeks of treatment. However, recovery time varies. Follow these instructions at home: Managing pain, stiffness, and swelling  If directed, apply ice to the injured area during the first 24 hours after your injury. ? Put ice in a plastic bag. ? Place a towel between  your skin and the bag. ? Leave the ice on for 20 minutes, 2-3 times a day.  If directed, apply heat to the affected area as often as told by your health care provider. Use the heat source that your health care provider recommends, such as a moist heat pack or a heating pad. ? Place a towel between your skin and the heat source. ? Leave the heat on for 20-30 minutes. ? Remove the heat if your skin turns bright red. This is especially important if you are unable to feel pain, heat, or cold. You may have a greater risk of getting burned. Activity  Rest and return to your normal activities as told by your health care provider. Ask your health care provider what activities are safe for you.  Avoid activities that take a lot of effort (are strenuous) for as long as told by your health care provider.  Do exercises as told by your health care provider. General instructions   Take over-the-counter and prescription medicines only as told by your health care provider.  If you have questions or concerns about safety while taking pain medicine, talk with your health care provider.  Do not drive or operate heavy machinery until you know how your pain medicine affects you.  Do not use any tobacco products, such as cigarettes, chewing tobacco, and e-cigarettes. Tobacco can delay bone healing. If you need help quitting, ask your health care provider.  Keep all follow-up visits as told by your health care provider. This is important. How is this prevented?  Warm up and stretch before being active.  Cool down and stretch after being active.  Give your body time to rest between periods of activity.  Avoid: ? Being physically inactive for long periods at a time. ? Exercising or playing sports when you are tired or in pain.  Use correct form when playing sports and lifting heavy objects.  Use good posture when sitting and standing.  Maintain a healthy weight.  Sleep on a mattress with medium  firmness to support your back.  Make sure to use equipment that fits you, including shoes that fit well.  Be safe and responsible while being active to avoid falls.  Do at least 150 minutes of moderate-intensity exercise each week, such as brisk walking or water aerobics. Try a form of exercise that takes stress off your back, such as swimming or stationary cycling.  Maintain physical fitness, including: ? Strength. ? Flexibility. ? Cardiovascular fitness. ? Endurance. Contact a health care provider if:  Your back pain does not improve after 6 weeks of treatment.  Your symptoms get worse. Get help right away if:  Your back pain is severe.  You are unable to stand or walk.  You develop pain in your legs.  You develop weakness in your buttocks or legs.  You have difficulty controlling when you urinate or when you have a bowel movement. This information is not intended to replace advice given to you by your health  care provider. Make sure you discuss any questions you have with your health care provider. Document Released: 03/12/2005 Document Revised: 11/17/2015 Document Reviewed: 12/22/2014 Elsevier Interactive Patient Education  2017 [place] [facility] Ask your health care provider which exercises are safe for you. Do exercises exactly as told by your health care provider and adjust them as directed. It is normal to feel mild stretching, pulling, tightness, or discomfort as you do these exercises, but you should stop right away if you feel sudden pain or your pain gets worse. Do not begin these exercises until told by your health care provider. Stretching and range of motion exercises These exercises warm up your muscles and joints and improve the movement and flexibility of your back. These exercises also help to relieve pain, numbness, and tingling. Exercise A: Single knee to chest  1. Lie on your back on a firm surface with both legs straight. 2. Bend one  of your knees. Use your hands to move your knee up toward your chest until you feel a gentle stretch in your lower back and buttock. ? Hold your leg in this position by holding onto the front of your knee. ? Keep your other leg as straight as possible. 3. Hold for __________ seconds. 4. Slowly return to the starting position. 5. Repeat with your other leg. Repeat __________ times. Complete this exercise __________ times a day. Exercise B: Prone extension on elbows  1. Lie on your abdomen on a firm surface. 2. Prop yourself up on your elbows. 3. Use your arms to help lift your chest up until you feel a gentle stretch in your abdomen and your lower back. ? This will place some of your body weight on your elbows. If this is uncomfortable, try stacking pillows under your chest. ? Your hips should stay down, against the surface that you are lying on. Keep your hip and back muscles relaxed. 4. Hold for __________ seconds. 5. Slowly relax your upper body and return to the starting position. Repeat __________ times. Complete this exercise __________ times a day. Strengthening exercises These exercises build strength and endurance in your back. Endurance is the ability to use your muscles for a long time, even after they get tired. Exercise C: Pelvic tilt 1. Lie on your back on a firm surface. Bend your knees and keep your feet flat. 2. Tense your abdominal muscles. Tip your pelvis up toward the ceiling and flatten your lower back into the floor. ? To help with this exercise, you may place a small towel under your lower back and try to push your back into the towel. 3. Hold for __________ seconds. 4. Let your muscles relax completely before you repeat this exercise. Repeat __________ times. Complete this exercise __________ times a day. Exercise D: Alternating arm and leg raises  1. Get on your hands and knees on a firm surface. If you are on a hard floor, you may want to use padding to cushion  your knees, such as an exercise mat. 2. Line up your arms and legs. Your hands should be below your shoulders, and your knees should be below your hips. 3. Lift your left leg behind you. At the same time, raise your right arm and straighten it in front of you. ? Do not lift your leg higher than your hip. ? Do not lift your arm higher than your shoulder. ? Keep your abdominal and back muscles tight. ? Keep your hips facing the ground. ?  Do not arch your back. ? Keep your balance carefully, and do not hold your breath. 4. Hold for __________ seconds. 5. Slowly return to the starting position and repeat with your right leg and your left arm. Repeat __________ times. Complete this exercise __________times a day. Exercise J: Single leg lower with bent knees 1. Lie on your back on a firm surface. 2. Tense your abdominal muscles and lift your feet off the floor, one foot at a time, so your knees and hips are bent in an "L" shape (at about 90 degrees). ? Your knees should be over your hips and your lower legs should be parallel to the floor. 3. Keeping your abdominal muscles tense and your knee bent, slowly lower one of your legs so your toe touches the ground. 4. Lift your leg back up to return to the starting position. ? Do not hold your breath. ? Do not let your back arch. Keep your back flat against the ground. 5. Repeat with your other leg. Repeat __________ times. Complete this exercise __________ times a day. Posture and body mechanics  Body mechanics refers to the movements and positions of your body while you do your daily activities. Posture is part of body mechanics. Good posture and healthy body mechanics can help to relieve stress in your body's tissues and joints. Good posture means that your spine is in its natural S-curve position (your spine is neutral), your shoulders are pulled back slightly, and your head is not tipped forward. The following are general guidelines for applying  improved posture and body mechanics to your everyday activities. Standing   When standing, keep your spine neutral and your feet about hip-width apart. Keep a slight bend in your knees. Your ears, shoulders, and hips should line up.  When you do a task in which you stand in one place for a long time, place one foot up on a stable object that is 2-4 inches (5-10 cm) high, such as a footstool. This helps keep your spine neutral. Sitting   When sitting, keep your spine neutral and keep your feet flat on the floor. Use a footrest, if necessary, and keep your thighs parallel to the floor. Avoid rounding your shoulders, and avoid tilting your head forward.  When working at a desk or a computer, keep your desk at a height where your hands are slightly lower than your elbows. Slide your chair under your desk so you are close enough to maintain good posture.  When working at a computer, place your monitor at a height where you are looking straight ahead and you do not have to tilt your head forward or downward to look at the screen. Resting   When lying down and resting, avoid positions that are most painful for you.  If you have pain with activities such as sitting, bending, stooping, or squatting (flexion-based activities), lie in a position in which your body does not bend very much. For example, avoid curling up on your side with your arms and knees near your chest (fetal position).  If you have pain with activities such as standing for a long time or reaching with your arms (extension-based activities), lie with your spine in a neutral position and bend your knees slightly. Try the following positions: ? Lying on your side with a pillow between your knees. ? Lying on your back with a pillow under your knees. Lifting   When lifting objects, keep your feet at least shoulder-width apart and tighten your  abdominal muscles.  Bend your knees and hips and keep your spine neutral. It is important  to lift using the strength of your legs, not your back. Do not lock your knees straight out.  Always ask for help to lift heavy or awkward objects. This information is not intended to replace advice given to you by your health care provider. Make sure you discuss any questions you have with your health care provider. Document Released: 03/12/2005 Document Revised: 11/17/2015 Document Reviewed: 12/22/2014 Elsevier Interactive Patient Education  [employer].

## 2017-05-01 ENCOUNTER — Telehealth: Payer: Self-pay | Admitting: Family Medicine

## 2017-05-01 NOTE — Telephone Encounter (Signed)
Please advise 

## 2017-05-01 NOTE — Telephone Encounter (Signed)
Copied from [place] [id number]. Topic: Quick Communication - See Telephone Encounter >> May 01, 2017  8:29 AM Ether Griffins B wrote: CRM for notification. See Telephone encounter for:  Pt states she is felling a little better but is hoping to get a refill on prednisone and oxycodone.  05/01/17.

## 2017-05-02 MED ORDER — PREDNISONE 10 MG PO TABS: ORAL_TABLET | ORAL | 0 refills | Status: DC

## 2017-05-02 MED ORDER — OXYCODONE-ACETAMINOPHEN 5-325 MG PO TABS: 1.0000 | ORAL_TABLET | ORAL | 0 refills | Status: DC | PRN

## 2017-05-02 NOTE — Telephone Encounter (Signed)
rxs sent in at lower dose. If still having pain, needs to let me refer her to PT. Does she want to proceed w/ this?  Has she seen Spine and Scoliosis

## 2017-05-02 NOTE — Telephone Encounter (Signed)
Pain isn't half as bad as it was, but when medication wears off its a 5/10. She stated let her see how it does for another week, then she will decide then. No [facility] hasn't called her yet.

## 2017-05-03 NOTE — Telephone Encounter (Signed)
Referral sent to [facility] on 2/8. Spoke with pt and she is aware of referral being sent. She said if she does not hear from them next week she will give [place] a call back.

## 2017-05-06 ENCOUNTER — Telehealth: Payer: Self-pay | Admitting: Family Medicine

## 2017-05-06 MED ORDER — CYCLOBENZAPRINE HCL 10 MG PO TABS: 10.0000 mg | ORAL_TABLET | Freq: Three times a day (TID) | ORAL | 1 refills | Status: DC | PRN

## 2017-05-06 NOTE — Telephone Encounter (Signed)
Copied from [place]. Topic: Quick Communication - See Telephone Encounter >> May 06, 2017  3:16 PM Cleaster Corin, NT wrote: CRM for notification. See Telephone encounter for:   05/06/17.pt. Calling to see if she can get her rx. Changed to 2x a day and not just at bedtime for flexeril. Pt. Can be reached at [address], [place] [address] Phone: [phone] Fax: [phone]

## 2017-05-06 NOTE — Telephone Encounter (Signed)
Please Advise

## 2017-05-06 NOTE — Telephone Encounter (Signed)
Sure. Alice Graham new rx to pharm.

## 2017-05-07 NOTE — Telephone Encounter (Signed)
Pt notified and stated she feels much better

## 2017-05-09 ENCOUNTER — Encounter: Payer: Self-pay | Admitting: Cardiology

## 2017-05-16 ENCOUNTER — Ambulatory Visit: Payer: BLUE CROSS/BLUE SHIELD

## 2017-05-16 DIAGNOSIS — E538 Deficiency of other specified B group vitamins: Secondary | ICD-10-CM

## 2017-05-16 NOTE — Progress Notes (Signed)
Administrations This Visit    cyanocobalamin ((VITAMIN B-12)) injection 1,000 mcg    Admin Date 05/16/2017 Action Given Dose 1000 mcg Route Intramuscular Administered By Delle Reining, RN         Dr. Brigitte Pulse in clinic at time of visit.

## 2017-05-17 ENCOUNTER — Ambulatory Visit: Payer: BLUE CROSS/BLUE SHIELD | Admitting: Cardiology

## 2017-05-27 ENCOUNTER — Telehealth: Payer: Self-pay | Admitting: Family Medicine

## 2017-05-27 NOTE — Telephone Encounter (Unsigned)
Copied from [place]. Topic: Quick Communication - See Telephone Encounter >> May 27, 2017  4:00 PM Percell Belt A wrote: CRM for notification. See Telephone encounter for:  pt called in and said that she pulled another muscle and would like to know if she could get her  predniSONE (DELTASONE) 10 MG tablet [[id number]]  refilled that she rec'd on 2/7  [facility] [address]    05/27/17.

## 2017-05-28 NOTE — Telephone Encounter (Signed)
Patient was advise to come in.  She stated the pain is in her groin and moving up to her stomach.    Patient stated she would call back to schedule appointment

## 2017-05-29 ENCOUNTER — Ambulatory Visit: Payer: BLUE CROSS/BLUE SHIELD | Admitting: Family Medicine

## 2017-05-29 ENCOUNTER — Telehealth: Payer: Self-pay | Admitting: Family Medicine

## 2017-05-29 NOTE — Telephone Encounter (Signed)
Called and spoke to pt to remind them of their apt tomorrow. Advised of building number and time restrictions.

## 2017-05-30 ENCOUNTER — Encounter: Payer: Self-pay | Admitting: Family Medicine

## 2017-05-30 ENCOUNTER — Ambulatory Visit (INDEPENDENT_AMBULATORY_CARE_PROVIDER_SITE_OTHER): Payer: BLUE CROSS/BLUE SHIELD

## 2017-05-30 ENCOUNTER — Ambulatory Visit: Payer: BLUE CROSS/BLUE SHIELD | Admitting: Family Medicine

## 2017-05-30 VITALS — BP 118/81 | HR 89 | Temp 97.4°F | Resp 18 | Ht 64.5 in | Wt 160.0 lb

## 2017-05-30 DIAGNOSIS — Z1211 Encounter for screening for malignant neoplasm of colon: Secondary | ICD-10-CM | POA: Diagnosis not present

## 2017-05-30 DIAGNOSIS — E2839 Other primary ovarian failure: Secondary | ICD-10-CM | POA: Diagnosis not present

## 2017-05-30 DIAGNOSIS — R1030 Lower abdominal pain, unspecified: Secondary | ICD-10-CM

## 2017-05-30 DIAGNOSIS — Z23 Encounter for immunization: Secondary | ICD-10-CM

## 2017-05-30 DIAGNOSIS — Z119 Encounter for screening for infectious and parasitic diseases, unspecified: Secondary | ICD-10-CM

## 2017-05-30 DIAGNOSIS — Z79899 Other long term (current) drug therapy: Secondary | ICD-10-CM | POA: Diagnosis not present

## 2017-05-30 LAB — POCT CBC
Granulocyte percent: 54 %G (ref 37–80)
HEMATOCRIT: 37.7 % (ref 37.7–47.9)
Hemoglobin: 12.5 g/dL (ref 12.2–16.2)
Lymph, poc: 2.2 (ref 0.6–3.4)
MCH, POC: 28.4 pg (ref 27–31.2)
MCHC: 33.1 g/dL (ref 31.8–35.4)
MCV: 85.8 fL (ref 80–97)
MID (CBC): 0.5 (ref 0–0.9)
MPV: 7.1 fL (ref 0–99.8)
POC GRANULOCYTE: 3.2 (ref 2–6.9)
POC LYMPH %: 37 % (ref 10–50)
POC MID %: 9 % (ref 0–12)
Platelet Count, POC: 333 10*3/uL (ref 142–424)
RBC: 4.39 M/uL (ref 4.04–5.48)
RDW, POC: 15.6 %
WBC: 5.9 10*3/uL (ref 4.6–10.2)

## 2017-05-30 LAB — POC MICROSCOPIC URINALYSIS (UMFC): MUCUS RE: ABSENT

## 2017-05-30 LAB — POCT URINALYSIS DIP (MANUAL ENTRY)
BILIRUBIN UA: NEGATIVE mg/dL
Blood, UA: NEGATIVE
Glucose, UA: NEGATIVE mg/dL
Leukocytes, UA: NEGATIVE
Nitrite, UA: NEGATIVE
Protein Ur, POC: NEGATIVE mg/dL
Spec Grav, UA: 1.015 (ref 1.010–1.025)
Urobilinogen, UA: 1 E.U./dL
pH, UA: 6 (ref 5.0–8.0)

## 2017-05-30 LAB — GLUCOSE, POCT (MANUAL RESULT ENTRY): POC Glucose: 101 mg/dl — AB (ref 70–99)

## 2017-05-30 LAB — POCT GLYCOSYLATED HEMOGLOBIN (HGB A1C): HEMOGLOBIN A1C: 5.8

## 2017-05-30 MED ORDER — OXYCODONE-ACETAMINOPHEN 5-325 MG PO TABS: 1.0000 | ORAL_TABLET | ORAL | 0 refills | Status: DC | PRN

## 2017-05-30 MED ORDER — PREDNISONE 10 MG PO TABS: ORAL_TABLET | ORAL | 0 refills | Status: DC

## 2017-05-30 NOTE — Progress Notes (Signed)
Subjective:  By signing my name below, I, Alice Graham, attest that this documentation has been prepared under the direction and in the presence of Delman Cheadle, MD Electronically Signed: Ladene Artist, ED Scribe 05/30/2017 at 2:05 PM.   Patient ID: Alice Graham, female    DOB: 26-Jan-1956, 62 y.o.   MRN: [id number]  Chief Complaint  Patient presents with  . Groin Pain    x2 weeks; hurts to walk  . Back Pain    pain is radiating from groin area  . Medication Refill    wants a refill on her pain meds   HPI Alice Graham is a 62 y.o. female who presents to [place] at [facility]. Seen ~5 wks ago with 1 wk of severe low back L gluteal pain. Was in so severe pain she required hydrocodone called into pharmacy and delivered to her before she could make it into our office. She does have a h/o B12 deficiency and RA. On Celebrex at baseline, L-spine XR showed scoliosis and disc interverbal degeneration with SI joints showing diffuse osteopenia with degenerative change in SI joint in both hips. Also noted to have aortoiliac vascular calcifications. Treated with a 9 day 60 mg prednisone taper and oxycodone. Has been continuing to get monthly B12 shots. Increased flexeril to bid. Referral to [facility] was sent 1 month ago. After 1 wk pain improved by 50% so a 9 day 30 mg prednisone course was continued. Pt called 3 days ago saying she pulled another muscle in her groin, radiating to abdomen.   Pt states that she was mopping 2 wks ago. States she bent to rinse the mop when she rotated her hip with her R foot planted and felt a pull in her groin. She did notice minimal swelling to the L inner thigh which has resolved. Reports an aching sensation with ambulating but reports that pain has minimally improved over the past 2 days. She has taken flexeril bid without drowsiness and previously prescribed oxycodone which she states takes the edge off. She also has some prednisone remaining but has not taken them for  this flare. Denies back pain at this time, redness, warmth or vaginal discharge.  Past Medical History:  Diagnosis Date  . Arthritis   . Hyperlipidemia   . Hypertension    Current Outpatient Medications on File Prior to Visit  Medication Sig Dispense Refill  . Acetaminophen (TYLENOL EXTRA STRENGTH PO) Take by mouth.    Marland Kitchen albuterol (PROVENTIL HFA;VENTOLIN HFA) 108 (90 Base) MCG/ACT inhaler Inhale 2 puffs into the lungs every 6 (six) hours as needed for wheezing or shortness of breath. 1 Inhaler 0  . ALPRAZolam (XANAX) 0.5 MG tablet TAKE 1 TABLET BY MOUTH EVERY NIGHT AT BEDTIME AS NEEDED FOR ANXIETY. 30 tablet 1  . aspirin 81 MG tablet Take 81 mg by mouth daily.    Marland Kitchen atorvastatin (LIPITOR) 20 MG tablet TAKE 1 TABLET(20 MG) BY MOUTH DAILY 90 tablet 1  . celecoxib (CELEBREX) 100 MG capsule TAKE 1 CAPSULE(100 MG) BY MOUTH TWICE DAILY 60 capsule 2  . Cyanocobalamin (VITAMIN B-12) 2500 MCG SUBL Place 2,500 mcg under the tongue daily.    . cyclobenzaprine (FLEXERIL) 10 MG tablet Take 1 tablet (10 mg total) by mouth 3 (three) times daily as needed for muscle spasms. 90 tablet 1  . Docusate Calcium (STOOL SOFTENER PO) Take by mouth.    . fluticasone (FLONASE) 50 MCG/ACT nasal spray Place 2 sprays into both nostrils daily. 16 g 6  .  folic acid (FOLVITE) 1 MG tablet Take 1 tablet (1 mg total) by mouth daily. 90 tablet 3  . HYDROcodone-acetaminophen (NORCO/VICODIN) 5-325 MG tablet Take 1 tablet by mouth every 6 (six) hours as needed for moderate pain. 12 tablet 0  . methotrexate (RHEUMATREX) 2.5 MG tablet Take 6 tablets (15 mg total) by mouth once a week. Caution:Chemotherapy. Protect from light. 84 tablet 1  . metoprolol succinate (TOPROL-XL) 25 MG 24 hr tablet Take 1 tablet (25 mg total) by mouth daily. 90 tablet 1  . oxyCODONE-acetaminophen (PERCOCET/ROXICET) 5-325 MG tablet Take 1 tablet by mouth every 4 (four) hours as needed for severe pain. 30 tablet 0  . predniSONE (DELTASONE) 10 MG tablet Take 3  tabs qd x 3d, then 2 tabs qd x 3d then 1 tab qd x 3d. 18 tablet 0   Current Facility-Administered Medications on File Prior to Visit  Medication Dose Route Frequency Provider Last Rate Last Dose  . cyanocobalamin ((VITAMIN B-12)) injection 1,000 mcg  1,000 mcg Intramuscular Q30 days Shawnee Knapp, MD   1,000 mcg at 05/16/17 1402   No Known Allergies   Past Surgical History:  Procedure Laterality Date  . childbirth    . TUBAL LIGATION     Family History  Problem Relation Age of Onset  . Heart failure Unknown   . Stroke Unknown   . Diabetes Unknown   . COPD Unknown   . Diabetes Mother   . Heart disease Mother 21       MI that resulted in death  . Heart failure Mother   . Cancer Father 2       colon cancer  . Heart disease Father   . Hyperlipidemia Father   . Hypertension Father   . Stroke Father    Social History   Socioeconomic History  . Marital status: Single    Spouse name: None  . Number of children: 2  . Years of education: college   . Highest education level: None  Social Needs  . Financial resource strain: None  . Food insecurity - worry: None  . Food insecurity - inability: None  . Transportation needs - medical: None  . Transportation needs - non-medical: None  Occupational History  . Occupation: Marine scientist- Nurse, learning disability    Employer: [employer]     Comment: [employer]   Tobacco Use  . Smoking status: Former Research scientist (life sciences)  . Smokeless tobacco: Never Used  Substance and Sexual Activity  . Alcohol use: No  . Drug use: No  . Sexual activity: None  Other Topics Concern  . None  Social History Narrative   Lives with daughter   Caffeine use: 2 cups daily coffee or large sweet tea almost daily   Depression screen [facility] 2/9 05/30/2017 04/23/2017 03/14/2017 12/19/2016 11/16/2016  Decreased Interest 0 0 0 0 0  Down, Depressed, Hopeless 0 0 0 0 0  PHQ - 2 Score 0 0 0 0 0    Review of Systems  Genitourinary: Negative for vaginal discharge.  Musculoskeletal: Positive  for arthralgias and joint swelling (L groin/inner thigh). Negative for back pain.  Skin: Negative for color change.      Objective:   Physical Exam  Constitutional: She is oriented to person, place, and time. She appears well-developed and well-nourished. No distress.  HENT:  Head: Normocephalic and atraumatic.  Eyes: Conjunctivae and EOM are normal.  Neck: Neck supple. No tracheal deviation present.  Cardiovascular: Normal rate.  Pulmonary/Chest: Effort normal. No respiratory distress.  Abdominal: Soft. Bowel sounds are normal. She exhibits no mass. There is no tenderness.  No pain over bladder or adnexa.  Musculoskeletal: Normal range of motion.  Mildly tender to palpation over anterior R inguinal. Pain in R hip with flexion above 90. Neg straight leg rise. Severe pain with external rotation, normal internal. L hip has decreased internal rotation but otherwise normal.  Lymphadenopathy:       Right: No inguinal adenopathy present.       Left: No inguinal adenopathy present.  Neurological: She is alert and oriented to person, place, and time.  Skin: Skin is warm and dry.  Psychiatric: She has a normal mood and affect. Her behavior is normal.  Nursing note and vitals reviewed.   BP 118/81   Pulse 89   Temp (!) 97.4 F (36.3 C) (Oral)   Resp 18   Ht 5' 4.5" (1.638 m)   Wt 160 lb (72.6 kg)   SpO2 98%   BMI 27.04 kg/m     Results for orders placed or performed in visit on 05/30/17  POCT glycosylated hemoglobin (Hb A1C)  Result Value Ref Range   Hemoglobin A1C 5.8   POCT glucose (manual entry)  Result Value Ref Range   POC Glucose 101 (A) 70 - 99 mg/dl  POCT CBC  Result Value Ref Range   WBC 5.9 4.6 - 10.2 K/uL   Lymph, poc 2.2 0.6 - 3.4   POC LYMPH PERCENT 37.0 10 - 50 %L   MID (cbc) 0.5 0 - 0.9   POC MID % 9.0 0 - 12 %M   POC Granulocyte 3.2 2 - 6.9   Granulocyte percent 54.0 37 - 80 %G   RBC 4.39 4.04 - 5.48 M/uL   Hemoglobin 12.5 12.2 - 16.2 g/dL   HCT, POC 37.7  37.7 - 47.9 %   MCV 85.8 80 - 97 fL   MCH, POC 28.4 27 - 31.2 pg   MCHC 33.1 31.8 - 35.4 g/dL   RDW, POC 15.6 %   Platelet Count, POC 333 142 - 424 K/uL   MPV 7.1 0 - 99.8 fL  POCT urinalysis dipstick  Result Value Ref Range   Color, UA yellow yellow   Clarity, UA clear clear   Glucose, UA negative negative mg/dL   Bilirubin, UA small (A) negative   Ketones, POC UA negative negative mg/dL   Spec Grav, UA 1.015 1.010 - 1.025   Blood, UA negative negative   pH, UA 6.0 5.0 - 8.0   Protein Ur, POC negative negative mg/dL   Urobilinogen, UA 1.0 0.2 or 1.0 E.U./dL   Nitrite, UA Negative Negative   Leukocytes, UA Negative Negative  POCT Microscopic Urinalysis ([facility])  Result Value Ref Range   WBC,UR,HPF,POC None None WBC/hpf   RBC,UR,HPF,POC None None RBC/hpf   Bacteria None None, Too numerous to count   Mucus Absent Absent   Epithelial Cells, UR Per Microscopy Few (A) None, Too numerous to count cells/hpf   Dg Hip Unilat W Or W/o Pelvis 2-3 Views Right  Result Date: 05/30/2017 CLINICAL DATA:  Severe RIGHT groin pain, severe pain with minimal RIGHT hip external rotation, history of rheumatoid arthritis EXAM: DG HIP (WITH OR WITHOUT PELVIS) 2-3V RIGHT COMPARISON:  04/23/2017 FINDINGS: Diffuse osseous demineralization. Hip and SI joint spaces preserved. No acute fracture, dislocation, or bone destruction. No erosive changes identified. Pelvic phleboliths noted. IMPRESSION: No acute abnormalities. Electronically Signed   By: Lavonia Dana M.D.   On: 05/30/2017 14:49  Assessment & Plan:   1. Severe groin pain   2. Screening for colon cancer   3. Screening examination for infectious disease   4. Estrogen deficiency   5. High risk medication use     Orders Placed This Encounter  Procedures  . DG HIP UNILAT W OR W/O PELVIS 2-3 VIEWS RIGHT    Standing Status:   Future    Number of Occurrences:   1    Standing Expiration Date:   05/30/2018    Order Specific Question:   Reason for Exam  (SYMPTOM  OR DIAGNOSIS REQUIRED)    Answer:   severe right groin pain, severe pain with minimal right hip external rotation, + RA    Order Specific Question:   Preferred imaging location?    Answer:   External  . DG Bone Density    Standing Status:   Future    Standing Expiration Date:   07/31/2018    Order Specific Question:   Reason for Exam (SYMPTOM  OR DIAGNOSIS REQUIRED)    Answer:   estrogen deficiency, frequent steroid use, caucasian, petite    Order Specific Question:   Preferred imaging location?    Answer:   [facility]  . Tdap vaccine greater than or equal to 7yo IM  . CK  . Sedimentation Rate  . C-reactive protein  . Cologuard  . HIV antibody  . HIV antibody  . POCT glycosylated hemoglobin (Hb A1C)  . POCT glucose (manual entry)  . POCT CBC  . POCT urinalysis dipstick  . POCT Microscopic Urinalysis ([facility])    Meds ordered this encounter  Medications  . oxyCODONE-acetaminophen (PERCOCET/ROXICET) 5-325 MG tablet    Sig: Take 1-2 tablets by mouth every 4 (four) hours as needed for severe pain.    Dispense:  60 tablet    Refill:  0  . predniSONE (DELTASONE) 10 MG tablet    Sig: 6-5-4-3-2-1 tabs po qd    Dispense:  21 tablet    Refill:  0    I personally performed the services described in this documentation, which was scribed in my presence. The recorded information has been reviewed and considered, and addended by me as needed.   Delman Cheadle, M.D.  Primary Care at [facility] [address] [place], [place] [address] [phone] phone [phone] fax  06/02/17 11:36 AM

## 2017-05-30 NOTE — Patient Instructions (Addendum)
You need to schedule your mammogram and your DEXA bone scan.  Please call [facility] at [phone] to schedule.   We recommend that you schedule a mammogram for breast cancer screening. Typically, you do not need a referral to do this. Please contact a local imaging center to schedule your mammogram.  [facility] - [phone]  *ask for the Radiology Department [facility] - [phone] or [phone]  [facility] - [phone] [place] [phone] [facility] - [phone]  *ask for the [facility] - [phone]  *ask for the Radiology Department [facility] - [phone]  *ask for the [facility] - [phone]     IF you received an x-ray today, you will receive an invoice from [facility]. Please contact [facility] at [phone] with questions or concerns regarding your invoice.   IF you received labwork today, you will receive an invoice from [facility]. Please contact [facility] at [phone] with questions or concerns regarding your invoice.   Our billing staff will not be able to assist you with questions regarding bills from these companies.  You will be contacted with the lab results as soon as they are available. The fastest way to get your results is to activate your My Chart account. Instructions are located on the last page of this paperwork. If you have not heard from [place] regarding the results in 2 weeks, please contact this office.

## 2017-05-31 LAB — HIV ANTIBODY (ROUTINE TESTING W REFLEX): HIV SCREEN 4TH GENERATION: NONREACTIVE

## 2017-05-31 LAB — SEDIMENTATION RATE: SED RATE: 58 mm/h — AB (ref 0–40)

## 2017-05-31 LAB — C-REACTIVE PROTEIN: CRP: 12.3 mg/L — ABNORMAL HIGH (ref 0.0–4.9)

## 2017-05-31 LAB — CK: CK TOTAL: 36 U/L (ref 24–173)

## 2017-06-12 ENCOUNTER — Other Ambulatory Visit: Payer: Self-pay | Admitting: Orthopaedic Surgery

## 2017-06-12 DIAGNOSIS — M545 Low back pain: Secondary | ICD-10-CM

## 2017-06-13 ENCOUNTER — Ambulatory Visit: Payer: BLUE CROSS/BLUE SHIELD | Admitting: Family Medicine

## 2017-06-20 ENCOUNTER — Ambulatory Visit (INDEPENDENT_AMBULATORY_CARE_PROVIDER_SITE_OTHER): Payer: BLUE CROSS/BLUE SHIELD

## 2017-06-20 DIAGNOSIS — E538 Deficiency of other specified B group vitamins: Secondary | ICD-10-CM

## 2017-06-20 NOTE — Progress Notes (Signed)
Administrations This Visit    cyanocobalamin ((VITAMIN B-12)) injection 1,000 mcg    Admin Date 06/20/2017 Action Given Dose 1000 mcg Route Intramuscular Administered By Delle Reining, RN

## 2017-06-26 ENCOUNTER — Other Ambulatory Visit: Payer: BLUE CROSS/BLUE SHIELD

## 2017-07-03 ENCOUNTER — Encounter: Payer: Self-pay | Admitting: Physician Assistant

## 2017-07-04 ENCOUNTER — Other Ambulatory Visit: Payer: BLUE CROSS/BLUE SHIELD

## 2017-07-09 LAB — COLOGUARD: COLOGUARD: NEGATIVE

## 2017-07-11 ENCOUNTER — Ambulatory Visit: Payer: BLUE CROSS/BLUE SHIELD | Admitting: Family Medicine

## 2017-07-18 ENCOUNTER — Other Ambulatory Visit: Payer: Self-pay | Admitting: Family Medicine

## 2017-07-24 ENCOUNTER — Encounter: Payer: Self-pay | Admitting: Family Medicine

## 2017-07-26 ENCOUNTER — Encounter: Payer: Self-pay | Admitting: Physician Assistant

## 2017-07-26 ENCOUNTER — Ambulatory Visit: Payer: BLUE CROSS/BLUE SHIELD | Admitting: Family Medicine

## 2017-07-26 ENCOUNTER — Other Ambulatory Visit: Payer: Self-pay

## 2017-07-26 ENCOUNTER — Ambulatory Visit (INDEPENDENT_AMBULATORY_CARE_PROVIDER_SITE_OTHER): Payer: BLUE CROSS/BLUE SHIELD | Admitting: Physician Assistant

## 2017-07-26 VITALS — BP 130/83 | HR 100 | Temp 98.3°F | Ht 63.0 in | Wt 163.4 lb

## 2017-07-26 DIAGNOSIS — E538 Deficiency of other specified B group vitamins: Secondary | ICD-10-CM | POA: Diagnosis not present

## 2017-07-26 DIAGNOSIS — R000 Tachycardia, unspecified: Secondary | ICD-10-CM

## 2017-07-26 NOTE — Patient Instructions (Addendum)
  Take one metoprolol in the morning and one at night. Come back in and discuss your progress with Dr. Brigitte Pulse.    IF you received an x-ray today, you will receive an invoice from [facility]. Please contact [facility] at [phone] with questions or concerns regarding your invoice.   IF you received labwork today, you will receive an invoice from [facility]. Please contact [facility] at [phone] with questions or concerns regarding your invoice.   Our billing staff will not be able to assist you with questions regarding bills from these companies.  You will be contacted with the lab results as soon as they are available. The fastest way to get your results is to activate your My Chart account. Instructions are located on the last page of this paperwork. If you have not heard from [place] regarding the results in 2 weeks, please contact this office.

## 2017-07-26 NOTE — Progress Notes (Signed)
07/26/2017 3:16 PM   DOB: March 17, 1956 / MRN: [id number]  SUBJECTIVE:  Alice Graham is a 62 y.o. female presenting for elevated HR. patient tells me this is been going on for some time now and was started on metoprolol 25 mg by Dr. Brigitte Pulse for same.  Fortunately patient denies chest pain shortness of breath, leg swelling, orthopnea, dizziness.  Last TSH was normal last year.  Patient does have a history of grade 1 diastolic dysfunction found on echocardiogram.  She is also noted to have a profound B12 deficiency and is requesting her IM injection today.      She has No Known Allergies.   She  has a past medical history of Hyperlipidemia, Hypertension, IBS (irritable bowel syndrome), Ocular migraine, and Rheumatoid arthritis ([place]) (2007).    She  reports that she has quit smoking. She has never used smokeless tobacco. She reports that she does not drink alcohol or use drugs. She  has no sexual activity history on file. The patient  has a past surgical history that includes childbirth and Tubal ligation.  Her family history includes COPD in her unknown relative; Cancer (age of onset: 39) in her father; Diabetes in her mother, sister, and unknown relative; Heart disease in her father and sister; Heart disease (age of onset: 60) in her mother; Heart failure in her mother and unknown relative; Hyperlipidemia in her father; Hypertension in her father; Polycystic kidney disease in her son; Stroke in her father and unknown relative.  Review of Systems  Respiratory: Negative for cough and shortness of breath.   Cardiovascular: Negative for chest pain, orthopnea and leg swelling.    The problem list and medications were reviewed and updated by myself where necessary and exist elsewhere in the encounter.   OBJECTIVE:  BP 130/83 (BP Location: Left Arm, Patient Position: Sitting, Cuff Size: Normal)   Pulse 100   Temp 98.3 F (36.8 C) (Oral)   Ht 5\' 3"  (1.6 m)   Wt 163 lb 6.4 oz (74.1 kg)   SpO2 98%    BMI 28.95 kg/m   Wt Readings from Last 3 Encounters:  07/26/17 163 lb 6.4 oz (74.1 kg)  05/30/17 160 lb (72.6 kg)  04/23/17 158 lb (71.7 kg)   Temp Readings from Last 3 Encounters:  07/26/17 98.3 F (36.8 C) (Oral)  05/30/17 (!) 97.4 F (36.3 C) (Oral)  04/23/17 (!) 97.4 F (36.3 C) (Oral)   BP Readings from Last 3 Encounters:  07/26/17 130/83  05/30/17 118/81  04/23/17 (!) 144/86   Pulse Readings from Last 3 Encounters:  07/26/17 100  05/30/17 89  04/23/17 88      Physical Exam  Constitutional: She appears well-developed and well-nourished. No distress.  Cardiovascular: Regular rhythm.  No extrasystoles are present. Tachycardia present. Exam reveals no S3, no S4, no distant heart sounds and no friction rub.  No murmur heard. Pulmonary/Chest: Effort normal and breath sounds normal. No respiratory distress.  Skin: She is not diaphoretic.    No results found for this or any previous visit (from the past 72 hour(s)).  No results found.  ASSESSMENT AND PLAN:  [name] was seen today for heart rate check.  Diagnoses and all orders for this visit:  Tachycardia: Half-life on metoprolol succinate is 7 hours at best.  She is on a small dose.  I think is perfectly reasonable for her to take a metoprolol 25 mg in the morning as well as at night.  She complains mostly of elevated  heart rate upon waking in the morning.  She will come back and talk with Dr. Brigitte Pulse about her progress.    The patient is advised to call or return to clinic if she does not see an improvement in symptoms, or to seek the care of the closest emergency department if she worsens with the above plan.   Philis Fendt, MHS, PA-C Primary Care at [facility] 07/26/2017 3:16 PM

## 2017-08-15 ENCOUNTER — Other Ambulatory Visit: Payer: Self-pay | Admitting: Family Medicine

## 2017-08-20 ENCOUNTER — Other Ambulatory Visit: Payer: Self-pay | Admitting: Family Medicine

## 2017-08-20 ENCOUNTER — Encounter: Payer: Self-pay | Admitting: Family Medicine

## 2017-08-28 ENCOUNTER — Ambulatory Visit (INDEPENDENT_AMBULATORY_CARE_PROVIDER_SITE_OTHER): Payer: BLUE CROSS/BLUE SHIELD | Admitting: Family Medicine

## 2017-08-28 DIAGNOSIS — E538 Deficiency of other specified B group vitamins: Secondary | ICD-10-CM

## 2017-08-28 NOTE — Progress Notes (Signed)
B12 shot Given Nurse visit. Pt did not see provider.  [employer]  Kittie Plater, [place]

## 2017-09-02 ENCOUNTER — Ambulatory Visit: Payer: BLUE CROSS/BLUE SHIELD | Admitting: Family Medicine

## 2017-09-02 ENCOUNTER — Telehealth: Payer: Self-pay | Admitting: Family Medicine

## 2017-09-02 NOTE — Telephone Encounter (Signed)
Copied from [place] [phone]. Topic: Quick Communication - See Telephone Encounter >> Sep 02, 2017 10:05 AM Conception Chancy, NT wrote: CRM for notification. See Telephone encounter for: 09/02/17.  Patient is calling and states she is needing a refill on metoprolol succinate (TOPROL-XL) 25 MG 24 hr tablet. She states that when she was seen by Philis Fendt he changed her RX to 2 times a day. Patient states on 08/20/17 she only picked up a partial prescription due to not being able to afford the 90 tablets. They will not refill medication until the order is changed to 2x a day. Please advise as patient is needing a refill.  [facility] [address], [address] [place] [address] Phone: [phone] Fax: [phone]

## 2017-09-02 NOTE — Telephone Encounter (Signed)
Per Carlis Abbott note. Needs OV with Brigitte Pulse for this.

## 2017-09-04 NOTE — Telephone Encounter (Signed)
mychart messge sent to pt

## 2017-09-05 ENCOUNTER — Ambulatory Visit: Payer: BLUE CROSS/BLUE SHIELD | Admitting: Family Medicine

## 2017-09-05 ENCOUNTER — Encounter: Payer: Self-pay | Admitting: Family Medicine

## 2017-09-05 ENCOUNTER — Other Ambulatory Visit: Payer: Self-pay

## 2017-09-05 VITALS — BP 100/60 | HR 97 | Temp 98.3°F | Resp 16 | Ht 64.25 in | Wt 166.0 lb

## 2017-09-05 DIAGNOSIS — M0579 Rheumatoid arthritis with rheumatoid factor of multiple sites without organ or systems involvement: Secondary | ICD-10-CM | POA: Diagnosis not present

## 2017-09-05 DIAGNOSIS — I10 Essential (primary) hypertension: Secondary | ICD-10-CM

## 2017-09-05 DIAGNOSIS — R000 Tachycardia, unspecified: Secondary | ICD-10-CM

## 2017-09-05 DIAGNOSIS — E538 Deficiency of other specified B group vitamins: Secondary | ICD-10-CM

## 2017-09-05 DIAGNOSIS — R002 Palpitations: Secondary | ICD-10-CM

## 2017-09-05 DIAGNOSIS — E782 Mixed hyperlipidemia: Secondary | ICD-10-CM

## 2017-09-05 DIAGNOSIS — D510 Vitamin B12 deficiency anemia due to intrinsic factor deficiency: Secondary | ICD-10-CM | POA: Diagnosis not present

## 2017-09-05 MED ORDER — ATORVASTATIN CALCIUM 20 MG PO TABS: ORAL_TABLET | ORAL | 1 refills | Status: DC

## 2017-09-05 MED ORDER — ALPRAZOLAM 0.5 MG PO TABS: ORAL_TABLET | ORAL | 1 refills | Status: DC

## 2017-09-05 MED ORDER — CYCLOBENZAPRINE HCL 10 MG PO TABS: 10.0000 mg | ORAL_TABLET | Freq: Three times a day (TID) | ORAL | 1 refills | Status: DC | PRN

## 2017-09-05 MED ORDER — METOPROLOL SUCCINATE ER 50 MG PO TB24: 50.0000 mg | ORAL_TABLET | Freq: Every day | ORAL | 11 refills | Status: AC

## 2017-09-05 MED ORDER — CELECOXIB 100 MG PO CAPS: 100.0000 mg | ORAL_CAPSULE | Freq: Two times a day (BID) | ORAL | 5 refills | Status: DC | PRN

## 2017-09-05 NOTE — Patient Instructions (Addendum)
IF you received an x-ray today, you will receive an invoice from [facility]. Please contact [facility] at [phone] with questions or concerns regarding your invoice.   IF you received labwork today, you will receive an invoice from [facility]. Please contact [facility] at [phone] with questions or concerns regarding your invoice.   Our billing staff will not be able to assist you with questions regarding bills from these companies.  You will be contacted with the lab results as soon as they are available. The fastest way to get your results is to activate your My Chart account. Instructions are located on the last page of this paperwork. If you have not heard from [place] regarding the results in 2 weeks, please contact this office.     Sinus Tachycardia Sinus tachycardia is a kind of fast heartbeat. In sinus tachycardia, the heart beats more than 100 times a minute. Sinus tachycardia starts in a part of the heart called the sinus node. Sinus tachycardia may be harmless, or it may be a sign of a serious condition. What are the causes? This condition may be caused by:  Exercise or exertion.  A fever.  Pain.  Loss of body fluids (dehydration).  Severe bleeding (hemorrhage).  Anxiety and stress.  Certain substances, including: ? Alcohol. ? Caffeine. ? Tobacco and nicotine products. ? Diet pills. ? Illegal drugs.  Medical conditions including: ? Heart disease. ? An infection. ? An overactive thyroid (hyperthyroidism). ? A lack of red blood cells (anemia).  What are the signs or symptoms? Symptoms of this condition include:  A feeling that the heart is beating quickly (palpitations).  Suddenly noticing your heartbeat (cardiac awareness).  Dizziness.  Tiredness (fatigue).  Shortness of breath.  Chest pain.  Nausea.  Fainting.  How is this diagnosed? This condition is diagnosed with:  A physical exam.  Other tests, such as: ? Blood  tests. ? An electrocardiogram (ECG). This test measures the electrical activity of the heart. ? Holter monitoring. For this test, you wear a device that records your heartbeat for one or more days.  You may be referred to a heart specialist (cardiologist). How is this treated? Treatment for this condition depends on the cause or underlying condition. Treatment may involve:  Treating the underlying condition.  Taking new medicines or changing your current medicines as told by your health care provider.  Making changes to your diet or lifestyle.  Practicing relaxation methods.  Follow these instructions at home: Lifestyle  Do not use any products that contain nicotine or tobacco, such as cigarettes and e-cigarettes. If you need help quitting, ask your health care provider.  Learn relaxation methods, like deep breathing, to help you when you get stressed or anxious.  Do not use illegal drugs, such as cocaine.  Do not abuse alcohol. Limit alcohol intake to no more than 1 drink a day for non-pregnant women and 2 drinks a day for men. One drink equals 12 oz of beer, 5 oz of wine, or 1 oz of hard liquor.  Find time to rest and relax often. This reduces stress.  Avoid: ? Caffeine. ? Stimulants such as over-the-counter diet pills or pills that help you to stay awake. ? Situations that cause anxiety or stress. General instructions  Drink enough fluids to keep your urine clear or pale yellow.  Take over-the-counter and prescription medicines only as told by your health care provider.  Keep all follow-up visits as told by your health care provider. This  is important. Contact a health care provider if:  You have a fever.  You have vomiting or diarrhea that keeps happening (is persistent). Get help right away if:  You have pain in your chest, upper arms, jaw, or neck.  You become weak or dizzy.  You feel faint.  You have palpitations that do not go away. This information is  not intended to replace advice given to you by your health care provider. Make sure you discuss any questions you have with your health care provider. Document Released: 04/19/2004 Document Revised: 10/08/2015 Document Reviewed: 09/24/2014 Elsevier Interactive Patient Education  [employer].

## 2017-09-05 NOTE — Progress Notes (Addendum)
Subjective:  By signing my name below, I, Moises Blood, attest that this documentation has been prepared under the direction and in the presence of Delman Cheadle, MD. Electronically Signed: Moises Blood, [place]. 09/05/2017 , 1:46 PM .  Patient was seen in Room 1 .   Patient ID: Alice Graham, female    DOB: [birth year], 62 y.o.   MRN: [id number] Chief Complaint  Patient presents with  . medication review    TOPROL-XL   HPI Alice Graham is a 62 y.o. female who presents to Primary Care at [facility] for medication discussion. She's been taking Toprol-XL 1 tablet bid. She states she's doing well. She's noticed her heart rate has improved to about 78-80 first thing in the morning. She informs having rare palpitations, but not regularly. She reports, previously, her heart rate would feel like it's pounding when she first wakes up in the morning; after switching to bid, she hasn't felt the heart pounding in the mornings. She recalls being placed on a monitor for a week by Dr. Jannifer Franklin in March 2018.   She's followed by rheumatologist, Dr. Dossie Der; recently started on Enbrel, as well as prednisone 10 mg qd for 1 month. She was also seen by back specialist, and had another xray done, which showed scoliosis. She was going to start gabapentin, but her back pain had improved. She also requests refill of xanax, which she rarely takes, and only as needed.   Past Medical History:  Diagnosis Date  . Hyperlipidemia   . Hypertension   . IBS (irritable bowel syndrome)   . Ocular migraine   . Rheumatoid arthritis ([place]) 2007   Past Surgical History:  Procedure Laterality Date  . childbirth    . TUBAL LIGATION     Prior to Admission medications   Medication Sig Start Date End Date Taking? Authorizing Provider  Acetaminophen (TYLENOL EXTRA STRENGTH PO) Take by mouth.   Yes [provider]  albuterol (PROVENTIL HFA;VENTOLIN HFA) 108 (90 Base) MCG/ACT inhaler Inhale 2 puffs into the lungs every 6 (six) hours as  needed for wheezing or shortness of breath. 01/19/16  Yes Alveda Reasons, MD  ALPRAZolam Duanne Moron) 0.5 MG tablet TAKE 1 TABLET BY MOUTH EVERY NIGHT AT BEDTIME AS NEEDED FOR ANXIETY. 12/19/16  Yes Shawnee Knapp, MD  aspirin 81 MG tablet Take 81 mg by mouth daily.   Yes [provider]  atorvastatin (LIPITOR) 20 MG tablet TAKE 1 TABLET(20 MG) BY MOUTH DAILY 03/14/17  Yes Shawnee Knapp, MD  celecoxib (CELEBREX) 100 MG capsule TAKE 1 CAPSULE(100 MG) BY MOUTH TWICE DAILY 08/15/17  Yes Shawnee Knapp, MD  cyclobenzaprine (FLEXERIL) 10 MG tablet Take 1 tablet (10 mg total) by mouth 3 (three) times daily as needed for muscle spasms. 05/06/17  Yes Shawnee Knapp, MD  Docusate Calcium (STOOL SOFTENER PO) Take by mouth.   Yes [provider]  fluticasone (FLONASE) 50 MCG/ACT nasal spray Place 2 sprays into both nostrils daily. 03/14/17  Yes Shawnee Knapp, MD  folic acid (FOLVITE) 1 MG tablet Take 1 tablet (1 mg total) by mouth daily. 03/14/17  Yes Shawnee Knapp, MD  methotrexate (RHEUMATREX) 2.5 MG tablet Take 6 tablets (15 mg total) by mouth once a week. Caution:Chemotherapy. Protect from light. 03/14/17  Yes Shawnee Knapp, MD  metoprolol succinate (TOPROL-XL) 25 MG 24 hr tablet TAKE 1 TABLET(25 MG) BY MOUTH DAILY 08/20/17  Yes Shawnee Knapp, MD  Cyanocobalamin (VITAMIN B-12) 2500 MCG SUBL Place 2,500  mcg under the tongue daily.    [provider]  ENBREL SURECLICK 50 MG/ML injection  08/22/17   [provider]   No Known Allergies Family History  Problem Relation Age of Onset  . Heart failure Unknown   . Stroke Unknown   . Diabetes Unknown   . COPD Unknown   . Diabetes Mother   . Heart disease Mother 66       MI that resulted in death  . Heart failure Mother   . Cancer Father 48       colon cancer  . Heart disease Father   . Hyperlipidemia Father   . Hypertension Father   . Stroke Father   . Diabetes Sister   . Heart disease Sister   . Polycystic kidney disease Son    Social  History   Socioeconomic History  . Marital status: Single    Spouse name: Not on file  . Number of children: 2  . Years of education: college   . Highest education level: Not on file  Occupational History  . 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 Financial resource strain: Not on file  . Food insecurity:    Worry: Not on file    Inability: Not on file  . Transportation needs:    Medical: Not on file    Non-medical: Not on file  Tobacco Use  . Smoking status: Former Research scientist (life sciences)  . Smokeless tobacco: Never Used  Substance and Sexual Activity  . Alcohol use: No  . Drug use: No  . Sexual activity: Not on file  Lifestyle  . Physical activity:    Days per week: Not on file    Minutes per session: Not on file  . Stress: Not on file  Relationships  . Social connections:    Talks on phone: Not on file    Gets together: Not on file    Attends religious service: Not on file    Active member of club or organization: Not on file    Attends meetings of clubs or organizations: Not on file    Relationship status: Not on file  Other Topics Concern  . Not on file  Social History Narrative   Lives with daughter   Caffeine use: 2 cups daily coffee or large sweet tea almost daily   Depression screen [facility] 2/9 09/05/2017 07/26/2017 05/30/2017 04/23/2017 03/14/2017  Decreased Interest 0 0 0 0 0  Down, Depressed, Hopeless 0 0 0 0 0  PHQ - 2 Score 0 0 0 0 0    Review of Systems  Constitutional: Negative for chills, fatigue, fever and unexpected weight change.  Respiratory: Negative for cough.   Gastrointestinal: Negative for constipation, diarrhea, nausea and vomiting.  Skin: Negative for rash and wound.  Neurological: Negative for dizziness, weakness and headaches.       Objective:   Physical Exam  Constitutional: She is oriented to person, place, and time. She appears well-developed and well-nourished. No distress.  HENT:    Head: Normocephalic and atraumatic.  Eyes: Pupils are equal, round, and reactive to light. EOM are normal.  Neck: Neck supple.  Cardiovascular: Normal rate.  Pulmonary/Chest: Effort normal. No respiratory distress.  Musculoskeletal: Normal range of motion.  Neurological: She is alert and oriented to person, place, and time.  Skin: Skin is warm and dry.  Psychiatric: She has a normal mood and affect. Her behavior  is normal.  Nursing note and vitals reviewed.   BP 100/60 (BP Location: Left Arm)   Pulse 97   Temp 98.3 F (36.8 C) (Oral)   Resp 16   Ht 5' 4.25" (1.632 m)   Wt 166 lb (75.3 kg)   SpO2 99%   BMI 28.27 kg/m   Orthostatic VS Lying 132/84 P 93 Sitting 130/84 P 102 Standing at 0 min 132/84 P 102    Assessment & Plan:   1. Tachycardia   2. Palpitations - a.m. sxs uncontrolled on toprol XL 39m qd but almost resolved since increasing to 262mXL bid - try changing to 50 XL qd for ease of compliance but if starts waking w/ palp again then cut it in half and go back to bid dosing.   Uses very rare xanax for breakthrough sxs that trigger anxiety  3. Rheumatoid arthritis involving multiple sites with positive rheumatoid factor presence ([place]- [facility] better on Enbrel and down from 6 tabs methotrexate to 1 a day but taking prednisone 10 daily as well. Refilled celebrex and flexeril. Saw [facility] ophtho 06/17/17. [facility] labs 07/11/17 CRP 34.9; RF 211.8, ESR 50, ccp ab IgG/IgA to high to read BONE DENSITY DONE 07/09/17 TB quant gold test neg, hepatitis panel negative but immune to hep B (BsAg, B Core Ab total, Bs Ab REACTIVE, C Ab) B ankle, B elbow, B hand/wrist, B foot xrays done  4. Pernicious anemia   5. Vitamin B12 deficiency - continue monthly IM 100024mreplacement as well as oral - was sufficient 9 mos ago - recheck level again at next OV  6. Essential hypertension   7.      Mixed hyperlipidemia - continue atorvastatin 20 - last lipids here 04/2016  LDL 114 down from 197 4 mos prior - needs FASTING labs at f/u  Orders Placed This Encounter  Procedures  . Orthostatic vital signs    Meds ordered this encounter  Medications  . metoprolol succinate (TOPROL-XL) 50 MG 24 hr tablet    Sig: Take 1 tablet (50 mg total) by mouth daily.    Dispense:  30 tablet    Refill:  11  . ALPRAZolam (XANAX) 0.5 MG tablet    Sig: TAKE 1 TABLET BY MOUTH EVERY NIGHT AT BEDTIME AS NEEDED FOR ANXIETY.    Dispense:  30 tablet    Refill:  1  . atorvastatin (LIPITOR) 20 MG tablet    Sig: TAKE 1 TABLET(20 MG) BY MOUTH DAILY    Dispense:  90 tablet    Refill:  1  . celecoxib (CELEBREX) 100 MG capsule    Sig: Take 1 capsule (100 mg total) by mouth 2 (two) times daily as needed for mild pain.    Dispense:  60 capsule    Refill:  5  . cyclobenzaprine (FLEXERIL) 10 MG tablet    Sig: Take 1 tablet (10 mg total) by mouth 3 (three) times daily as needed for muscle spasms.    Dispense:  90 tablet    Refill:  1    I personally performed the services described in this documentation, which was scribed in my presence. The recorded information has been reviewed and considered, and addended by me as needed.   EvaDelman Cheadle.D.  [facility] [phone]  09/18/17 12:50 AM

## 2017-09-27 ENCOUNTER — Ambulatory Visit (INDEPENDENT_AMBULATORY_CARE_PROVIDER_SITE_OTHER): Payer: BLUE CROSS/BLUE SHIELD | Admitting: Family Medicine

## 2017-09-27 DIAGNOSIS — E538 Deficiency of other specified B group vitamins: Secondary | ICD-10-CM

## 2017-09-27 NOTE — Progress Notes (Signed)
Nurse visit for vitamin B-12

## 2017-10-24 ENCOUNTER — Ambulatory Visit (INDEPENDENT_AMBULATORY_CARE_PROVIDER_SITE_OTHER): Payer: BLUE CROSS/BLUE SHIELD | Admitting: Family Medicine

## 2017-10-24 DIAGNOSIS — E538 Deficiency of other specified B group vitamins: Secondary | ICD-10-CM | POA: Diagnosis not present

## 2017-10-24 NOTE — Patient Instructions (Signed)
     IF you received an x-ray today, you will receive an invoice from [facility]. Please contact [facility] at [phone] with questions or concerns regarding your invoice.   IF you received labwork today, you will receive an invoice from [facility]. Please contact [facility] at [phone] with questions or concerns regarding your invoice.   Our billing staff will not be able to assist you with questions regarding bills from these companies.  You will be contacted with the lab results as soon as they are available. The fastest way to get your results is to activate your My Chart account. Instructions are located on the last page of this paperwork. If you have not heard from us regarding the results in 2 weeks, please contact this office.     

## 2017-11-13 ENCOUNTER — Telehealth: Payer: Self-pay | Admitting: Family Medicine

## 2017-11-13 NOTE — Telephone Encounter (Signed)
Dr. Brigitte Pulse advise. Dgaddy, CMA

## 2017-11-13 NOTE — Telephone Encounter (Signed)
Copied from [place] [phone]. Topic: Quick Communication - Rx Refill/Question >> Nov 13, 2017 12:30 PM Selinda Flavin B, [place] wrote: Medication: ALPRAZolam Duanne Moron) 0.5 MG tablet  Has the patient contacted their pharmacy? Yes.   (Agent: If no, request that the patient contact the pharmacy for the refill.) (Agent: If yes, when and what did the pharmacy advise?)  Preferred Pharmacy (with phone number or street name): [facility], [place]  Agent: Please be advised that RX refills may take up to 3 business days. We ask that you follow-up with your pharmacy.

## 2017-11-13 NOTE — Telephone Encounter (Signed)
Xanax 0.5 MG tab refill Last Refill:09/05/17 #30 with 1 refill Last OV: 09/05/17 medication discussed. PCP: Dr. Brigitte Pulse Pharmacy:[facility] and routed

## 2017-11-27 ENCOUNTER — Ambulatory Visit (INDEPENDENT_AMBULATORY_CARE_PROVIDER_SITE_OTHER): Payer: BLUE CROSS/BLUE SHIELD | Admitting: Family Medicine

## 2017-11-27 DIAGNOSIS — E538 Deficiency of other specified B group vitamins: Secondary | ICD-10-CM

## 2017-11-27 NOTE — Progress Notes (Signed)
B12 shot given today on RD. 157pm.

## 2017-12-24 ENCOUNTER — Other Ambulatory Visit: Payer: Self-pay | Admitting: Family Medicine

## 2017-12-25 NOTE — Telephone Encounter (Signed)
Requested Prescriptions  Pending Prescriptions Disp Refills   ALPRAZolam (XANAX) 0.5 MG tablet [Pharmacy Med Name: ALPRAZOLAM 0.5MG  TABLETS] 30 tablet 0    Sig: TAKE 1 TABLET BY MOUTH EVERY NIGHT AT BEDTIME AS NEEDED FOR ANXIETY.     Not Delegated - Psychiatry:  Anxiolytics/Hypnotics Failed - 12/24/2017  5:27 PM      Failed - This refill cannot be delegated      Failed - Urine Drug Screen completed in last 360 days.      Passed - Valid encounter within last 6 months    Recent Outpatient Visits          4 weeks ago Vitamin B12 deficiency   [facility] at [facility], Ranell Patrick, MD   2 months ago Vitamin B12 deficiency   [facility] at [facility], Laurey Arrow, MD   2 months ago Vitamin B12 deficiency   [facility] at [facility], Laurey Arrow, MD   3 months ago Tachycardia   [facility] at [facility], Laurey Arrow, MD   3 months ago Vitamin B 12 deficiency   Primary Care at [facility], Renette Butters, MD      Future Appointments            In 2 months Shawnee Knapp, MD Primary Care at [place], [facility]

## 2017-12-27 NOTE — Telephone Encounter (Signed)
Last rx written 6/13 filled 6/13 and 8/26 for xanax 0.5 #30.  Today I have utilized the [place] Controlled Substance Registry's online query to confirm compliance regarding the patient's controlled medications. My review reveals appropriate prescription fills and that I am the sole provider of these medications. Rechecks will occur regularly and the patient is aware of our use of the system.

## 2018-01-01 ENCOUNTER — Ambulatory Visit: Payer: BLUE CROSS/BLUE SHIELD | Admitting: Physician Assistant

## 2018-01-01 ENCOUNTER — Encounter: Payer: Self-pay | Admitting: Physician Assistant

## 2018-01-01 VITALS — BP 131/77 | HR 96 | Temp 98.8°F | Resp 16 | Ht 63.0 in | Wt 168.0 lb

## 2018-01-01 DIAGNOSIS — R21 Rash and other nonspecific skin eruption: Secondary | ICD-10-CM | POA: Diagnosis not present

## 2018-01-01 DIAGNOSIS — E538 Deficiency of other specified B group vitamins: Secondary | ICD-10-CM

## 2018-01-01 LAB — POCT SKIN KOH: Skin KOH, POC: NEGATIVE

## 2018-01-01 MED ORDER — ALBUTEROL SULFATE HFA 108 (90 BASE) MCG/ACT IN AERS: 2.0000 | INHALATION_SPRAY | Freq: Four times a day (QID) | RESPIRATORY_TRACT | 0 refills | Status: DC | PRN

## 2018-01-01 MED ORDER — TRIAMCINOLONE ACETONIDE 0.1 % EX CREA: 1.0000 "application " | TOPICAL_CREAM | Freq: Two times a day (BID) | CUTANEOUS | 0 refills | Status: AC

## 2018-01-01 NOTE — Progress Notes (Signed)
Monthly b12 shot given

## 2018-01-01 NOTE — Patient Instructions (Addendum)
This is consistent with dry skin and dermatitis. I suggest apply a moisturizer (that is not irritating to you) at least twice daily for the next month.  You may use the prescription topical steroid cream to the affected areas that are raised twice daily for the next 2 weeks. Start drinking at least 32 oz of water a day. Return to clinic if symptoms worsen, do not improve, or as needed.     If you have lab work done today you will be contacted with your lab results within the next 2 weeks.  If you have not heard from [place] then please contact us. The fastest way to get your results is to register for My Chart.   IF you received an x-ray today, you will receive an invoice from [facility]. Please contact [facility] at [phone] with questions or concerns regarding your invoice.   IF you received labwork today, you will receive an invoice from [facility]. Please contact [facility] at [phone] with questions or concerns regarding your invoice.   Our billing staff will not be able to assist you with questions regarding bills from these companies.  You will be contacted with the lab results as soon as they are available. The fastest way to get your results is to activate your My Chart account. Instructions are located on the last page of this paperwork. If you have not heard from [place] regarding the results in 2 weeks, please contact this office.

## 2018-01-01 NOTE — Progress Notes (Signed)
Alice Graham  MRN: [id number] DOB: Jun 27, 1955  Subjective:  Alice Graham is a 62 y.o. female seen in office today for a chief complaint of rash involving the trunk and ankles. Rash started 6 months ago. Lesions are flaky and raised in texture. Rash has changed over time. Will come and go. Rash is pruritic. Associated symptoms: none. Patient denies: abdominal pain, arthralgia, congestion, cough, crankiness, decrease in appetite, decrease in energy level, fever, headache, irritability, myalgia, nausea, sore throat and vomiting. Patient has not had contacts with similar rash. Patient has not had new exposures (soaps, lotions, laundry detergents, foods, medications, plants, insects or animals) but she is convinced it is her metoprolol because she read somewhere that it can cause a rash.  Applying lotion resolves patient's symptoms completely.  She only showers once every couple days.  She applies lotion only after she showers.  Has possible history of seasonal allergies.   Review of Systems  Per HPI  Patient Active Problem List   Diagnosis Date Noted  . Pernicious anemia 12/19/2016  . Vitamin B12 deficiency 12/19/2016  . Leg weakness, bilateral 05/23/2016  . Essential hypertension 10/13/2014  . Rheumatoid arthritis ([place]) 01/09/2013  . Hyperlipidemia 01/09/2013    Current Outpatient Medications on File Prior to Visit  Medication Sig Dispense Refill  . Acetaminophen (TYLENOL EXTRA STRENGTH PO) Take by mouth.    . ALPRAZolam (XANAX) 0.5 MG tablet TAKE 1 TABLET BY MOUTH EVERY NIGHT AT BEDTIME AS NEEDED FOR ANXIETY 30 tablet 1  . aspirin 81 MG tablet Take 81 mg by mouth daily.    Marland Kitchen atorvastatin (LIPITOR) 20 MG tablet TAKE 1 TABLET(20 MG) BY MOUTH DAILY 90 tablet 1  . celecoxib (CELEBREX) 100 MG capsule Take 1 capsule (100 mg total) by mouth 2 (two) times daily as needed for mild pain. 60 capsule 5  . Cyanocobalamin (VITAMIN B-12) 2500 MCG SUBL Place 2,500 mcg under the tongue daily.    .  cyclobenzaprine (FLEXERIL) 10 MG tablet Take 1 tablet (10 mg total) by mouth 3 (three) times daily as needed for muscle spasms. 90 tablet 1  . Docusate Calcium (STOOL SOFTENER PO) Take by mouth.    Scarlette Shorts SURECLICK 50 MG/ML injection   2  . fluticasone (FLONASE) 50 MCG/ACT nasal spray Place 2 sprays into both nostrils daily. 16 g 6  . folic acid (FOLVITE) 1 MG tablet Take 1 tablet (1 mg total) by mouth daily. 90 tablet 3  . methotrexate (RHEUMATREX) 2.5 MG tablet Take 6 tablets (15 mg total) by mouth once a week. Caution:Chemotherapy. Protect from light. (Patient not taking: Reported on 01/01/2018) 84 tablet 1  . metoprolol succinate (TOPROL-XL) 50 MG 24 hr tablet Take 1 tablet (50 mg total) by mouth daily. 30 tablet 11   Current Facility-Administered Medications on File Prior to Visit  Medication Dose Route Frequency Provider Last Rate Last Dose  . cyanocobalamin ((VITAMIN B-12)) injection 1,000 mcg  1,000 mcg Intramuscular Q30 days Shawnee Knapp, MD   1,000 mcg at 01/01/18 1110    No Known Allergies    Social History   Socioeconomic History  . Marital status: Single    Spouse name: Not on file  . Number of children: 2  . Years of education: college   . Highest education level: Not on file  Occupational History  . 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 Financial resource strain: Not on  file  . Food insecurity:    Worry: Not on file    Inability: Not on file  . Transportation needs:    Medical: Not on file    Non-medical: Not on file  Tobacco Use  . Smoking status: Former Research scientist (life sciences)  . Smokeless tobacco: Never Used  Substance and Sexual Activity  . Alcohol use: No  . Drug use: No  . Sexual activity: Not on file  Lifestyle  . Physical activity:    Days per week: Not on file    Minutes per session: Not on file  . Stress: Not on file  Relationships  . Social connections:    Talks on phone: Not on file    Gets  together: Not on file    Attends religious service: Not on file    Active member of club or organization: Not on file    Attends meetings of clubs or organizations: Not on file    Relationship status: Not on file  . Intimate partner violence:    Fear of current or ex partner: Not on file    Emotionally abused: Not on file    Physically abused: Not on file    Forced sexual activity: Not on file  Other Topics Concern  . Not on file  Social History Narrative   Lives with daughter   Caffeine use: 2 cups daily coffee or large sweet tea almost daily    Objective:  BP 131/77 (BP Location: Right Arm, Patient Position: Sitting, Cuff Size: Large)   Pulse 96   Temp 98.8 F (37.1 C) (Oral)   Resp 16   Ht 5\' 3"  (1.6 m)   Wt 168 lb (76.2 kg)   SpO2 98%   BMI 29.76 kg/m   Physical Exam  Constitutional: She is oriented to person, place, and time. She appears well-developed and well-nourished. No distress.  HENT:  Head: Normocephalic and atraumatic.  Eyes: Conjunctivae are normal.  Neck: Normal range of motion.  Pulmonary/Chest: Effort normal.  Neurological: She is alert and oriented to person, place, and time.  Skin: Skin is warm and dry.  Dry, flaky skin noted on anterior abdomen and bilateral forearms. Multiple seborrheic keratosis lesions noted on anterior and posterior trunk. Two hyperpigmented erythematous dry patches noted on medial aspects of b/l ankles.     Psychiatric: She has a normal mood and affect.  Vitals reviewed.    Results for orders placed or performed in visit on 01/01/18 (from the past 24 hour(s))  POCT Skin KOH     Status: None   Collection Time: 01/01/18 11:55 AM  Result Value Ref Range   Skin KOH, POC Negative Negative     Assessment and Plan :  1. Rash and nonspecific skin eruption   KOH negative.  Pt has diffuse dry skin, which is likely contributing to her itching. Affected areas on the ankles are consistent in appearance with atopic dermatitis.   It  is reassuring that all of her symptoms resolve with use of moisturizer, however, she uses moisturizer infrequently at this time.  Recommend pt start applying moisturizer twice daily.  May use triamcinolone cream for affected areas on the ankles.  Do not suspect reaction to metoprolol at this time due to appearance.  Advised to return to clinic if symptoms worsen, do not improve, or as needed.  - POCT Skin KOH - triamcinolone cream (KENALOG) 0.1 %; Apply 1 application topically 2 (two) times daily.  Dispense: 30 g; Refill: 0  2. Vitamin B12  deficiency    Tenna Delaine PA-C  [facility] [place] 01/01/2018 11:59 AM

## 2018-01-02 ENCOUNTER — Encounter: Payer: Self-pay | Admitting: Physician Assistant

## 2018-01-21 ENCOUNTER — Other Ambulatory Visit: Payer: Self-pay | Admitting: Physician Assistant

## 2018-02-04 ENCOUNTER — Ambulatory Visit: Payer: BLUE CROSS/BLUE SHIELD | Admitting: Family Medicine

## 2018-02-06 ENCOUNTER — Ambulatory Visit (INDEPENDENT_AMBULATORY_CARE_PROVIDER_SITE_OTHER): Payer: BLUE CROSS/BLUE SHIELD | Admitting: Family Medicine

## 2018-02-06 DIAGNOSIS — E538 Deficiency of other specified B group vitamins: Secondary | ICD-10-CM | POA: Diagnosis not present

## 2018-02-06 MED ORDER — CYANOCOBALAMIN 1000 MCG/ML IJ SOLN
1000.0000 ug | Freq: Once | INTRAMUSCULAR | Status: AC
  Administered 2018-02-06: 1000 ug via INTRAMUSCULAR

## 2018-02-10 ENCOUNTER — Ambulatory Visit: Payer: BLUE CROSS/BLUE SHIELD | Admitting: Emergency Medicine

## 2018-02-12 ENCOUNTER — Other Ambulatory Visit: Payer: Self-pay | Admitting: Family Medicine

## 2018-02-12 NOTE — Telephone Encounter (Signed)
Requested medication (s) are due for refill today: yes  Requested medication (s) are on the active medication list: yes  Last refill:  09/05/17 390 with 1 refill  Future visit scheduled: yes  Notes to clinic:  Unable to fill per protocol    Requested Prescriptions  Pending Prescriptions Disp Refills   cyclobenzaprine (FLEXERIL) 10 MG tablet [Pharmacy Med Name: CYCLOBENZAPRINE 10MG  TABLETS] 90 tablet 0    Sig: TAKE 1 TABLET(10 MG) BY MOUTH THREE TIMES DAILY AS NEEDED FOR MUSCLE SPASMS     Not Delegated - Analgesics:  Muscle Relaxants Failed - 02/12/2018 12:27 PM      Failed - This refill cannot be delegated      Passed - Valid encounter within last 6 months    Recent Outpatient Visits          6 days ago Vitamin B12 deficiency   [facility], Laurey Arrow, MD   1 month ago Rash and nonspecific skin eruption   [facility] [place], [place] D, PA-C   2 months ago Vitamin B12 deficiency   [facility] [place], MD   3 months ago Vitamin B12 deficiency   [facility], Laurey Arrow, MD   4 months ago Vitamin B12 deficiency   [facility], Laurey Arrow, MD      Future Appointments            In 3 weeks Shawnee Knapp, MD [facility] [place], [facility]

## 2018-02-13 NOTE — Telephone Encounter (Signed)
Patient is requesting a refill of the following medications: Requested Prescriptions   Pending Prescriptions Disp Refills  . cyclobenzaprine (FLEXERIL) 10 MG tablet [Pharmacy Med Name: CYCLOBENZAPRINE 10MG  TABLETS] 90 tablet 0    Sig: TAKE 1 TABLET(10 MG) BY MOUTH THREE TIMES DAILY AS NEEDED FOR MUSCLE SPASMS    Date of patient request: 02/12/2018 Last office visit: 02/06/2018 Date of last refill: 09/05/2017 Last refill amount: 90 RF 1 Follow up time period per chart:

## 2018-02-16 ENCOUNTER — Other Ambulatory Visit: Payer: Self-pay | Admitting: Family Medicine

## 2018-02-17 ENCOUNTER — Other Ambulatory Visit: Payer: Self-pay | Admitting: Family Medicine

## 2018-02-17 NOTE — Telephone Encounter (Signed)
Pt called again about this refill, she has also called in the alprazolam today.  She would like someone to call her about these refills?    Pharmacy- [facility] on [address].   Best number  [phone]

## 2018-02-18 NOTE — Telephone Encounter (Signed)
Requested medication (s) are due for refill today: Yes   Requested medication (s) are on the active medication list: Yes  Last refill:  12/28/17  Future visit scheduled: Yes  Notes to clinic:  See request    Requested Prescriptions  Pending Prescriptions Disp Refills   ALPRAZolam (XANAX) 0.5 MG tablet [Pharmacy Med Name: ALPRAZOLAM 0.5MG  TABLETS] 30 tablet 0    Sig: TAKE 1 TABLET BY MOUTH EVERY NIGHT AT BEDTIME AS NEEDED FOR ANXIETY     Not Delegated - Psychiatry:  Anxiolytics/Hypnotics Failed - 02/17/2018  5:21 PM      Failed - This refill cannot be delegated      Failed - Urine Drug Screen completed in last 360 days.      Passed - Valid encounter within last 6 months    Recent Outpatient Visits          1 week ago Vitamin B12 deficiency   [facility], Laurey Arrow, MD   1 month ago Rash and nonspecific skin eruption   [facility] [place], [place] D, PA-C   2 months ago Vitamin B12 deficiency   [facility] at [place], MD   3 months ago Vitamin B12 deficiency   [facility], Laurey Arrow, MD   4 months ago Vitamin B12 deficiency   [facility], Laurey Arrow, MD      Future Appointments            In 2 weeks Shawnee Knapp, MD [facility] at [place], [facility]

## 2018-02-18 NOTE — Telephone Encounter (Signed)
Patient is requesting a refill of the following medications: Requested Prescriptions   Pending Prescriptions Disp Refills  . ALPRAZolam (XANAX) 0.5 MG tablet [Pharmacy Med Name: ALPRAZOLAM 0.5MG  TABLETS] 30 tablet 0    Sig: TAKE 1 TABLET BY MOUTH EVERY NIGHT AT BEDTIME AS NEEDED FOR ANXIETY    Date of patient request: 02/17/2018 Last office visit: 01/01/2018 Date of last refill: 12/27/2017 Last refill amount: 30 RF 1  Follow up time period per chart:

## 2018-02-24 ENCOUNTER — Other Ambulatory Visit: Payer: Self-pay

## 2018-02-24 MED ORDER — ALPRAZOLAM 0.5 MG PO TABS: 0.5000 mg | ORAL_TABLET | Freq: Every evening | ORAL | 1 refills | Status: AC | PRN

## 2018-02-24 NOTE — Telephone Encounter (Signed)
Pt informed of refills sent to pharmacy, she verbalized understanding and will make a follow-up appointment in two months.

## 2018-02-24 NOTE — Telephone Encounter (Signed)
Fax req refill alprazolam 0.5mg  tabs Sent to Dr. Brigitte Pulse

## 2018-02-24 NOTE — Telephone Encounter (Signed)
Need OV in 2 mos for any additional refills

## 2018-03-06 ENCOUNTER — Encounter: Payer: BLUE CROSS/BLUE SHIELD | Admitting: Family Medicine

## 2018-03-06 ENCOUNTER — Ambulatory Visit (INDEPENDENT_AMBULATORY_CARE_PROVIDER_SITE_OTHER): Payer: BLUE CROSS/BLUE SHIELD | Admitting: Family Medicine

## 2018-03-06 DIAGNOSIS — E538 Deficiency of other specified B group vitamins: Secondary | ICD-10-CM | POA: Diagnosis not present

## 2018-03-06 MED ORDER — CYANOCOBALAMIN 1000 MCG/ML IJ SOLN
1000.0000 ug | INTRAMUSCULAR | Status: AC
  Administered 2018-03-06: 1000 ug via INTRAMUSCULAR

## 2018-03-10 ENCOUNTER — Other Ambulatory Visit: Payer: Self-pay | Admitting: Family Medicine

## 2018-04-09 ENCOUNTER — Other Ambulatory Visit: Payer: Self-pay | Admitting: Family Medicine

## 2018-04-09 ENCOUNTER — Encounter: Payer: BLUE CROSS/BLUE SHIELD | Admitting: Family Medicine

## 2018-04-09 NOTE — Telephone Encounter (Signed)
Requested medication (s) are due for refill today -yes  Requested medication (s) are on the active medication list -yes  Future visit scheduled -yes  Last refill: 02/18/18  Notes to clinic: Patient requesting non delegated Rx- sent for provider review.  Requested Prescriptions  Pending Prescriptions Disp Refills   cyclobenzaprine (FLEXERIL) 10 MG tablet [Pharmacy Med Name: CYCLOBENZAPRINE 10MG  TABLETS] 90 tablet 0    Sig: TAKE 1 TABLET(10 MG) BY MOUTH THREE TIMES DAILY AS NEEDED FOR MUSCLE SPASMS     Not Delegated - Analgesics:  Muscle Relaxants Failed - 04/09/2018 10:25 AM      Failed - This refill cannot be delegated      Failed - Valid encounter within last 6 months    Recent Outpatient Visits          1 month ago Vitamin B12 deficiency   [facility], Laurey Arrow, MD   2 months ago Vitamin B12 deficiency   [facility], Laurey Arrow, MD   3 months ago Rash and nonspecific skin eruption   [facility] [place], [place] D, PA-C   4 months ago Vitamin B12 deficiency   [facility] [place], Ranell Patrick, MD   5 months ago Vitamin B12 deficiency   [facility], Laurey Arrow, MD      Future Appointments            In 1 month Brigitte Pulse Laurey Arrow, MD [facility] at [place], [facility]            Requested Prescriptions  Pending Prescriptions Disp Refills   cyclobenzaprine (FLEXERIL) 10 MG tablet [Pharmacy Med Name: CYCLOBENZAPRINE 10MG  TABLETS] 90 tablet 0    Sig: TAKE 1 TABLET(10 MG) BY MOUTH THREE TIMES DAILY AS NEEDED FOR MUSCLE SPASMS     Not Delegated - Analgesics:  Muscle Relaxants Failed - 04/09/2018 10:25 AM      Failed - This refill cannot be delegated      Failed - Valid encounter within last 6 months    Recent Outpatient Visits          1 month ago Vitamin B12 deficiency   [facility], Laurey Arrow, MD   2 months ago Vitamin B12 deficiency   [facility], Laurey Arrow, MD   3 months ago Rash and nonspecific  skin eruption   [facility] at [place], [place] D, PA-C   4 months ago Vitamin B12 deficiency   [facility] at [place], MD   5 months ago Vitamin B12 deficiency   [facility], Laurey Arrow, MD      Future Appointments            In 1 month Brigitte Pulse Laurey Arrow, MD [facility] at [place], [facility]

## 2018-04-10 NOTE — Telephone Encounter (Signed)
Please advise 

## 2018-04-23 ENCOUNTER — Other Ambulatory Visit: Payer: Self-pay | Admitting: Family Medicine

## 2018-04-23 NOTE — Telephone Encounter (Signed)
Requested medication (s) are due for refill today: Yes  Requested medication (s) are on the active medication list: Yes  Last refill:  09/05/17  Future visit scheduled: Yes  Notes to clinic:  Pt. Has not had a lipid panel in 2 years.    Requested Prescriptions  Pending Prescriptions Disp Refills   atorvastatin (LIPITOR) 20 MG tablet [Pharmacy Med Name: ATORVASTATIN 20MG  TABLETS] 90 tablet 1    Sig: TAKE 1 TABLET(20 MG) BY MOUTH DAILY     Cardiovascular:  Antilipid - Statins Failed - 04/23/2018 11:13 AM      Failed - Total Cholesterol in normal range and within 360 days    Cholesterol, Total  Date Value Ref Range Status  05/01/2016 181 100 - 199 mg/dL Final         Failed - LDL in normal range and within 360 days    LDL Calculated  Date Value Ref Range Status  05/01/2016 114 (H) 0 - 99 mg/dL Final         Failed - HDL in normal range and within 360 days    HDL  Date Value Ref Range Status  05/01/2016 47 >39 mg/dL Final         Failed - Triglycerides in normal range and within 360 days    Triglycerides  Date Value Ref Range Status  05/01/2016 98 0 - 149 mg/dL Final         Passed - Patient is not pregnant      Passed - Valid encounter within last 12 months    Recent Outpatient Visits          1 month ago Vitamin B12 deficiency   [facility], Laurey Arrow, MD   2 months ago Vitamin B12 deficiency   [facility], Laurey Arrow, MD   3 months ago Rash and nonspecific skin eruption   [facility] at [place], Tanzania D, PA-C   4 months ago Vitamin B12 deficiency   [facility] [place], Ranell Patrick, MD   6 months ago Vitamin B12 deficiency   [facility], Laurey Arrow, MD      Future Appointments            In 1 month Shawnee Knapp, MD [facility] at [place], [facility]         Signed Prescriptions Disp Refills   celecoxib (CELEBREX) 100 MG capsule 60 capsule 0    Sig: TAKE 1 CAPSULE(100 MG) BY MOUTH TWICE DAILY AS NEEDED FOR  MILD PAIN     Analgesics:  COX2 Inhibitors Failed - 04/23/2018 11:13 AM      Failed - Cr in normal range and within 360 days    Creat  Date Value Ref Range Status  01/19/2016 0.58 0.50 - 0.99 mg/dL Final    Comment:      For patients > or = 65 years of age: The upper reference limit for Creatinine is approximately 13% higher for people identified as African-American.      Creatinine, Ser  Date Value Ref Range Status  03/14/2017 0.59 0.57 - 1.00 mg/dL Final         Passed - HGB in normal range and within 360 days    Hemoglobin  Date Value Ref Range Status  05/30/2017 12.5 12.2 - 16.2 g/dL Final  03/14/2017 11.7 11.1 - 15.9 g/dL Final         Passed - Patient is not pregnant  Passed - Valid encounter within last 12 months    Recent Outpatient Visits          1 month ago Vitamin B12 deficiency   [facility], Laurey Arrow, MD   2 months ago Vitamin B12 deficiency   [facility], Laurey Arrow, MD   3 months ago Rash and nonspecific skin eruption   [facility] at [place], Tanzania D, PA-C   4 months ago Vitamin B12 deficiency   [facility] at [place], MD   6 months ago Vitamin B12 deficiency   [facility], Laurey Arrow, MD      Future Appointments            In 1 month Brigitte Pulse Laurey Arrow, MD [facility] at [place], [facility]

## 2018-06-04 ENCOUNTER — Encounter: Payer: BLUE CROSS/BLUE SHIELD | Admitting: Family Medicine

## 2019-12-05 IMAGING — DX DG LUMBAR SPINE 2-3V
3 series · 3 of 3 positions shown · non-contrast
Comparison: None.

CLINICAL DATA: Severe low back pain, gluteal pain

EXAM:
LUMBAR SPINE - 2-3 VIEW

[l-spine ap]
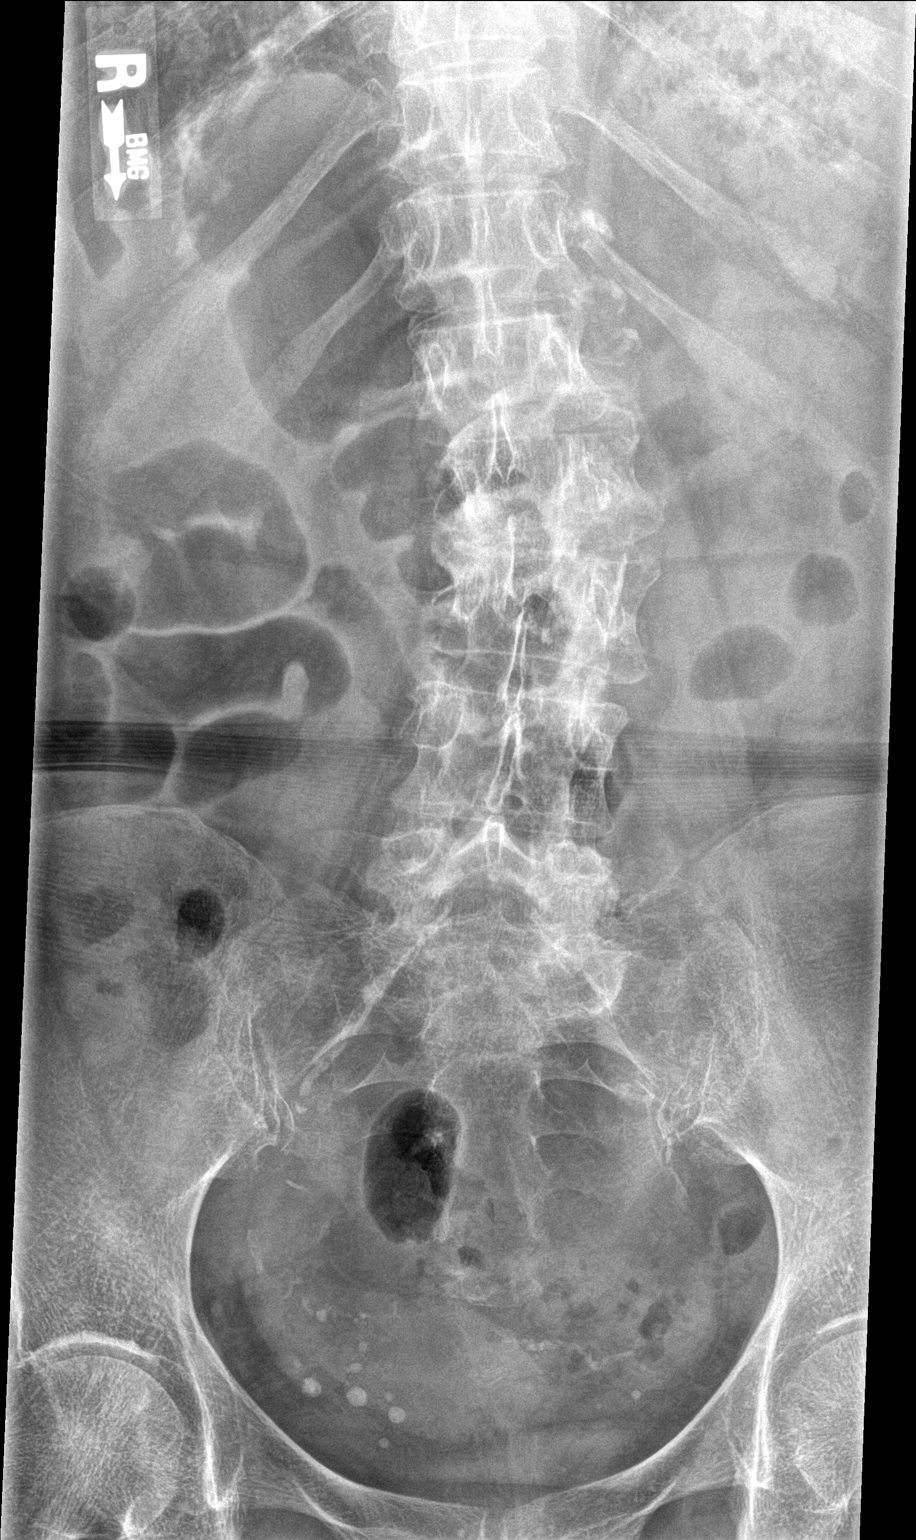

[l-spine lat]
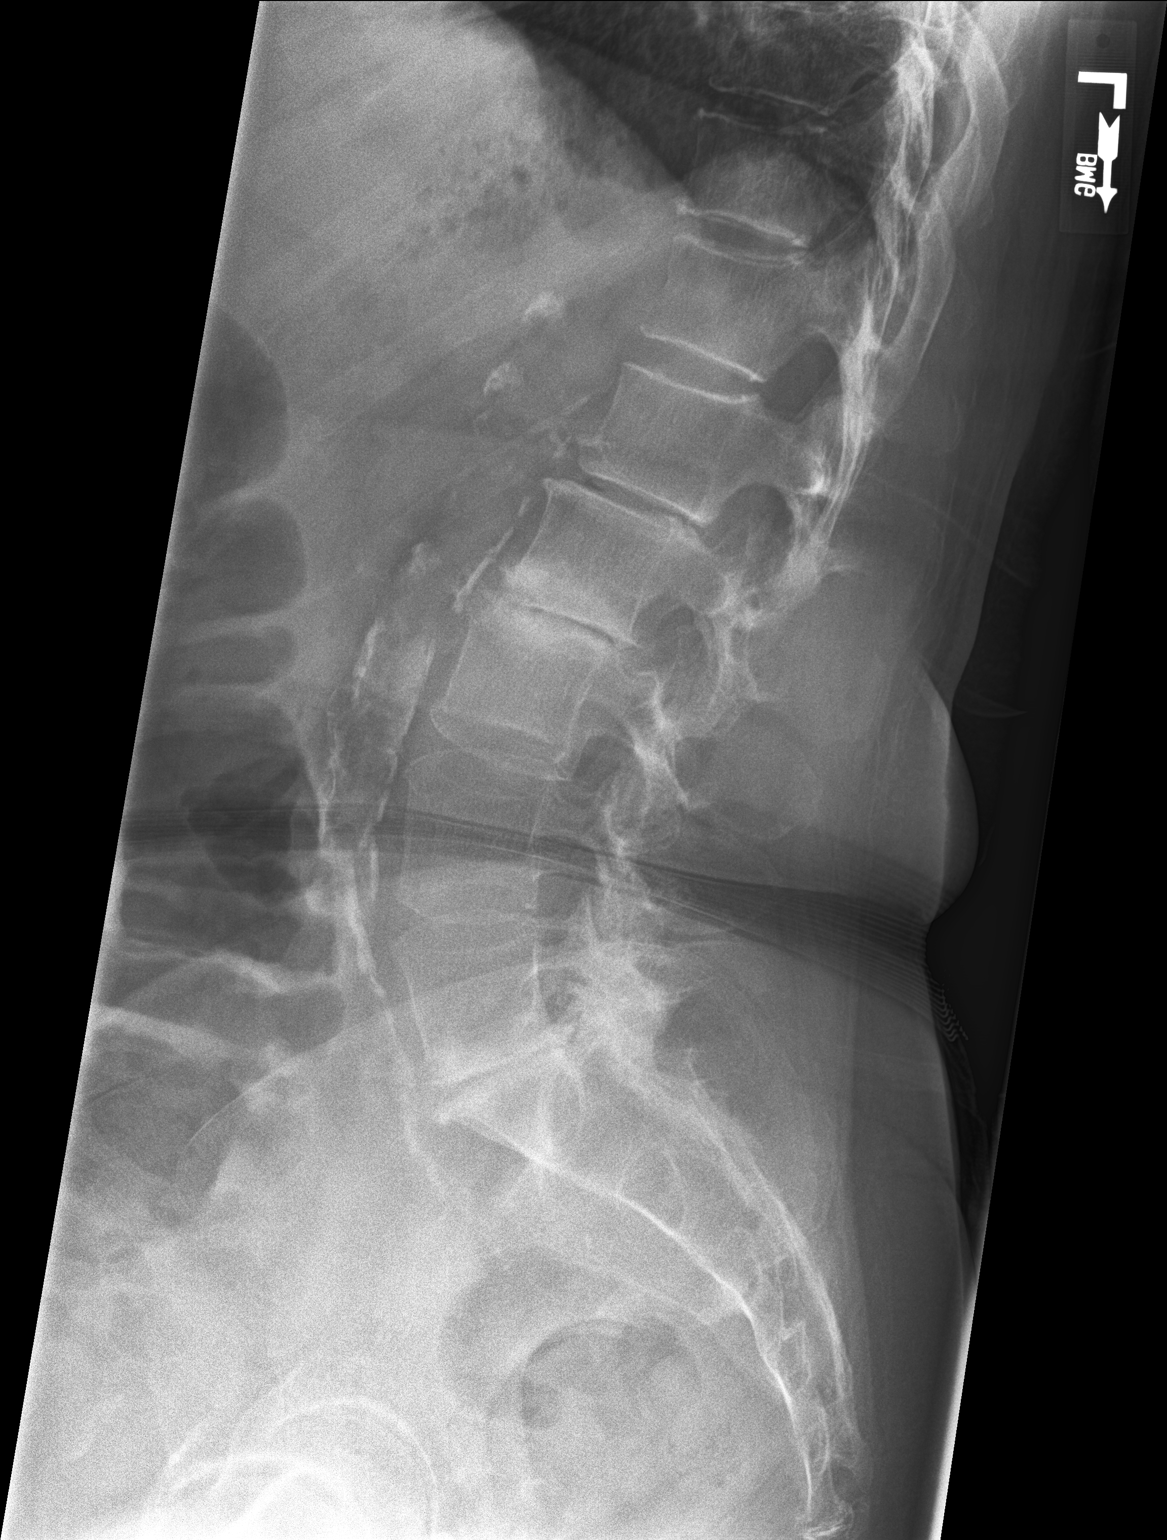

[l-spine l5-s1]
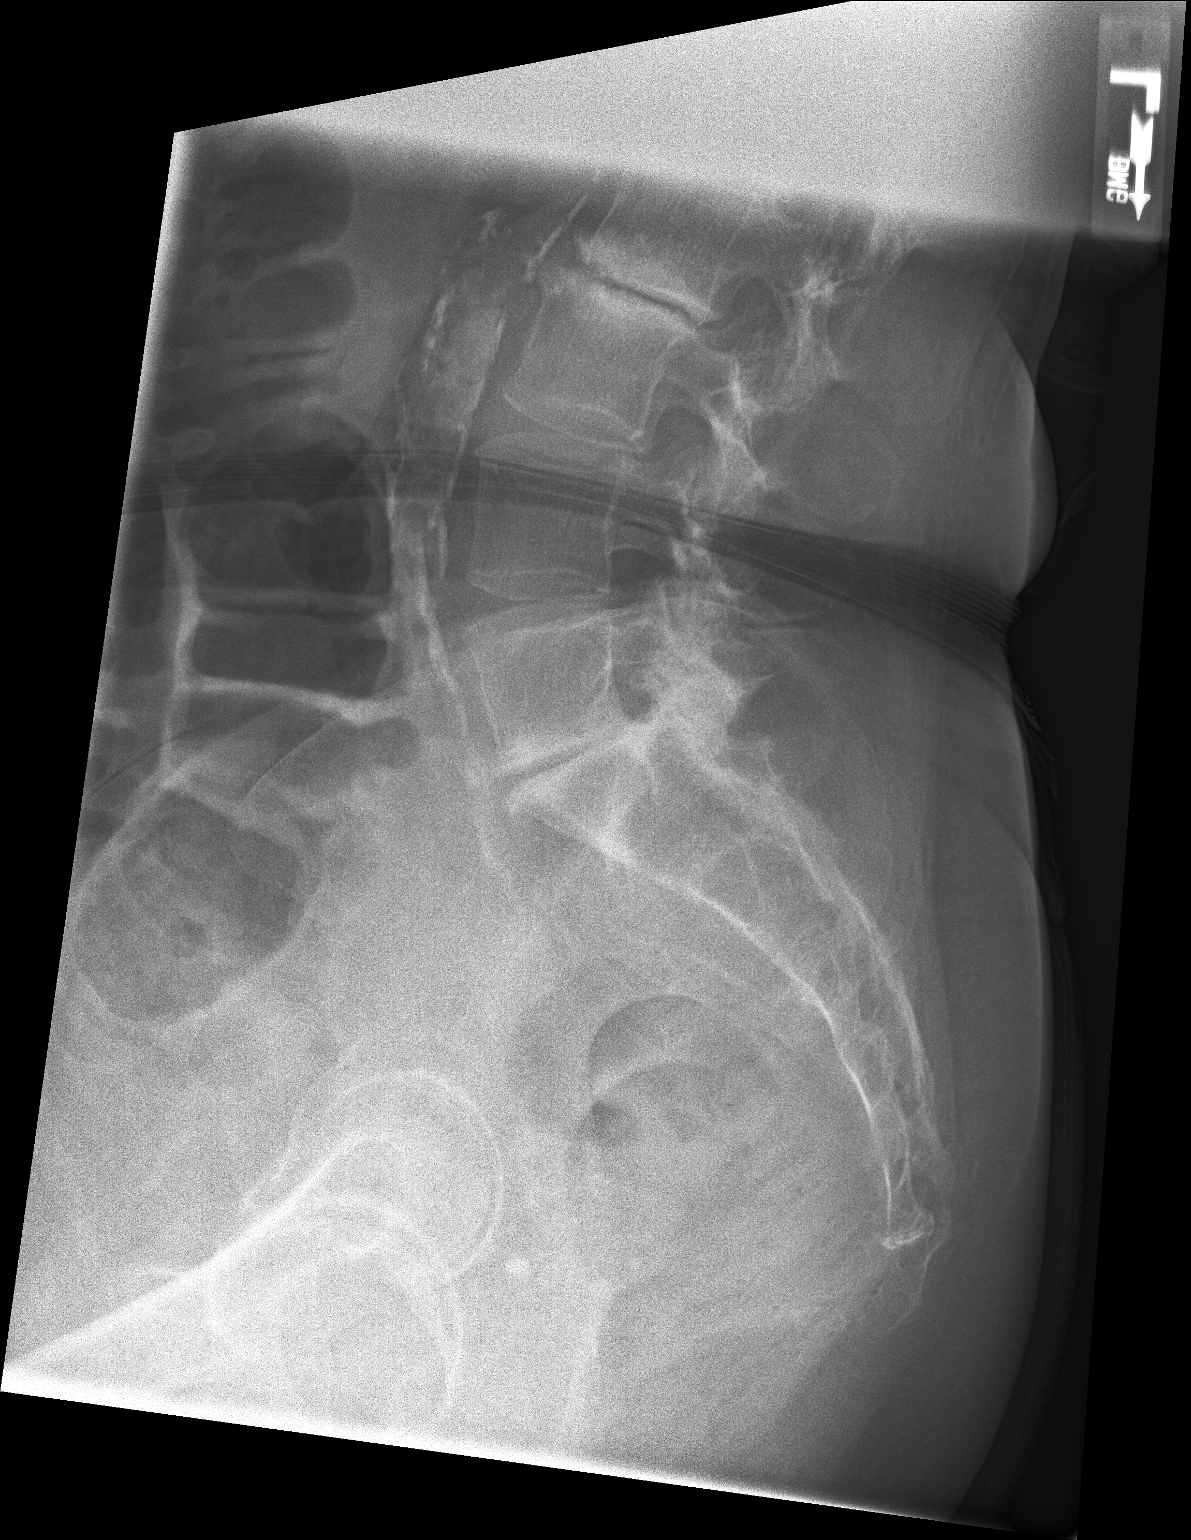

[3 of 3 positions shown; findings below may reference images not displayed]

FINDINGS: Leftward scoliosis in the mid lumbar spine. Degenerative disc
disease changes throughout the lumbar spine, most pronounced at L2-3
and L5-S1. Degenerative facet disease at L4-5 and L5-S1. No
subluxation or fracture. Aortic and iliac calcifications. No
aneurysm or adenopathy.
IMPRESSION: Leftward scoliosis. Degenerative disc and facet disease. No acute
bony abnormality.

Aortic atherosclerosis.

## 2019-12-05 IMAGING — DX DG SI JOINTS 3+V
3 series · 3 of 3 positions shown · non-contrast
Comparison: No recent prior.

CLINICAL DATA: Sudden onset severe back and gluteal pain.

EXAM:
BILATERAL SACROILIAC JOINTS - 3+ VIEW

[si joint (1 of 3)]
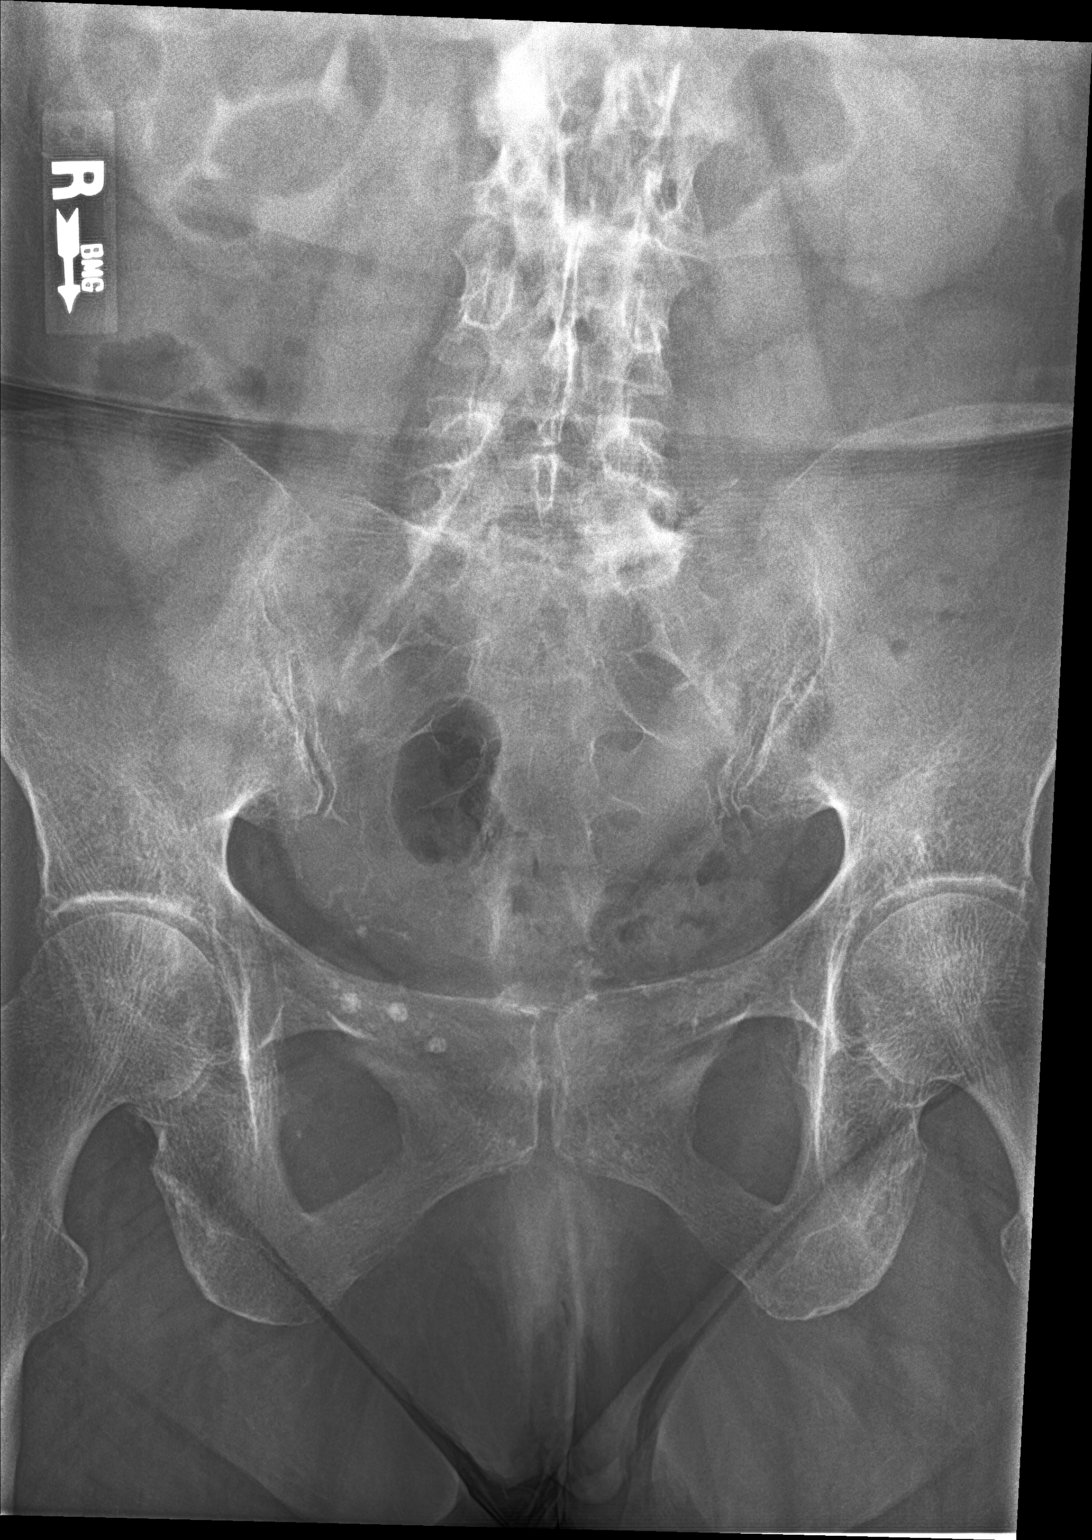

[si joint (2 of 3)]
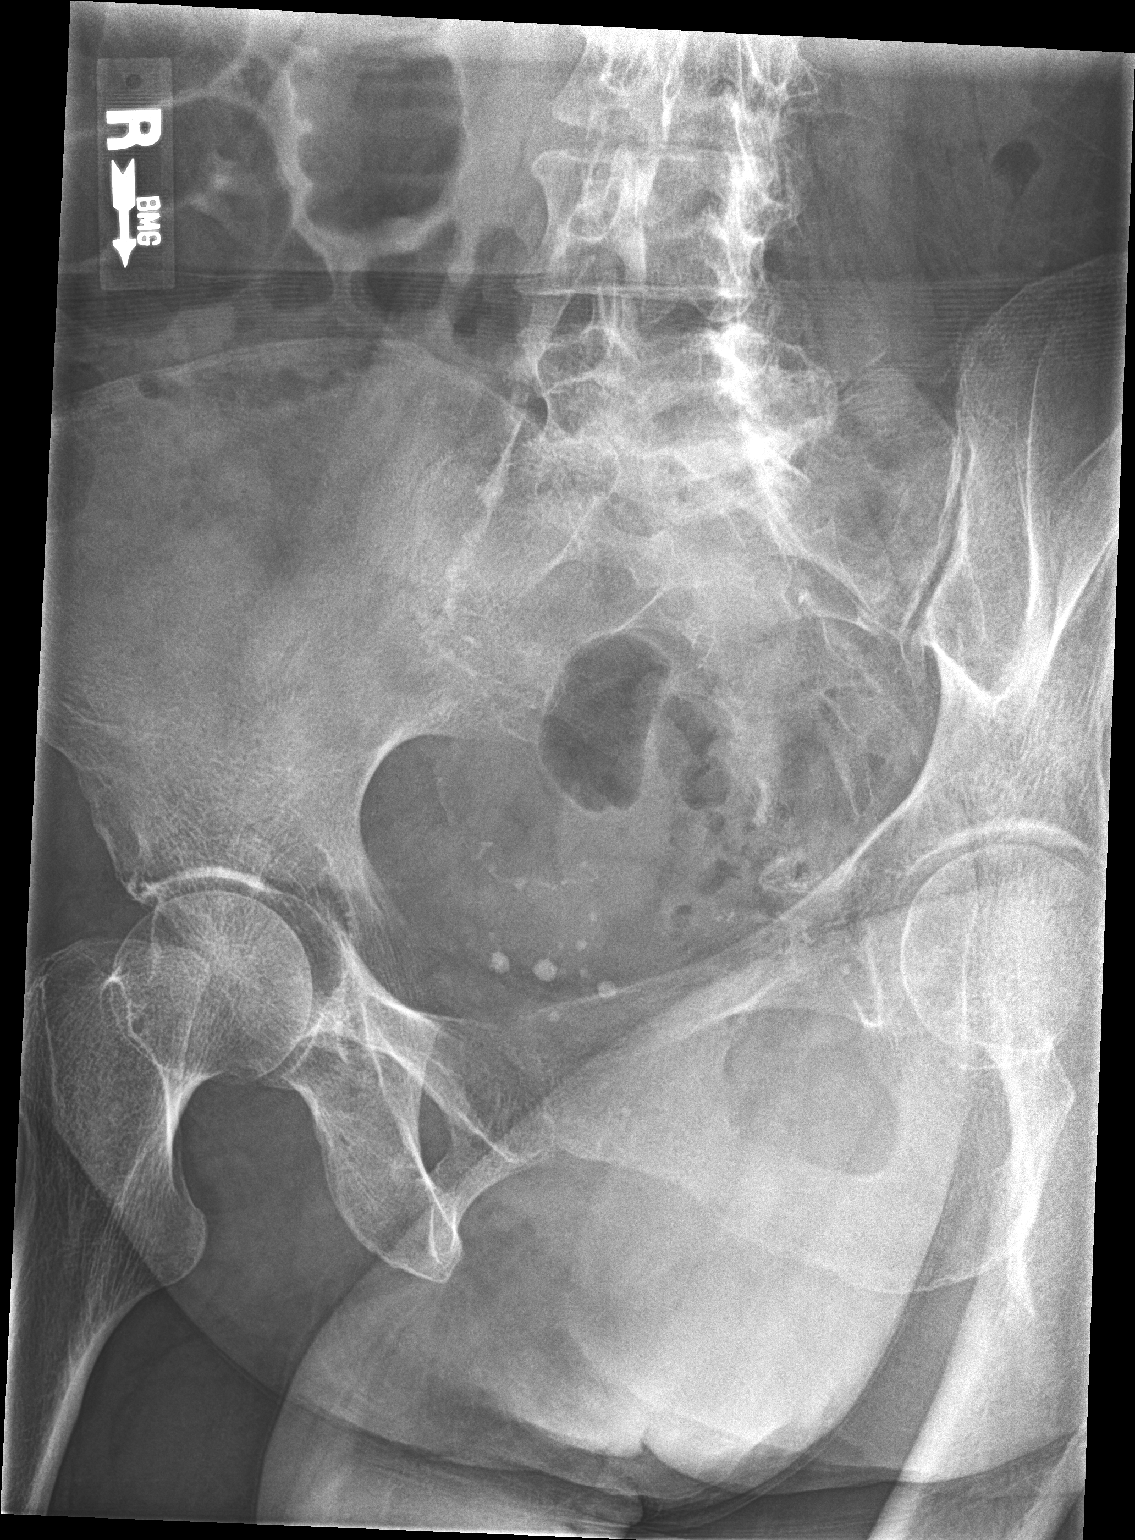

[si joint (3 of 3)]
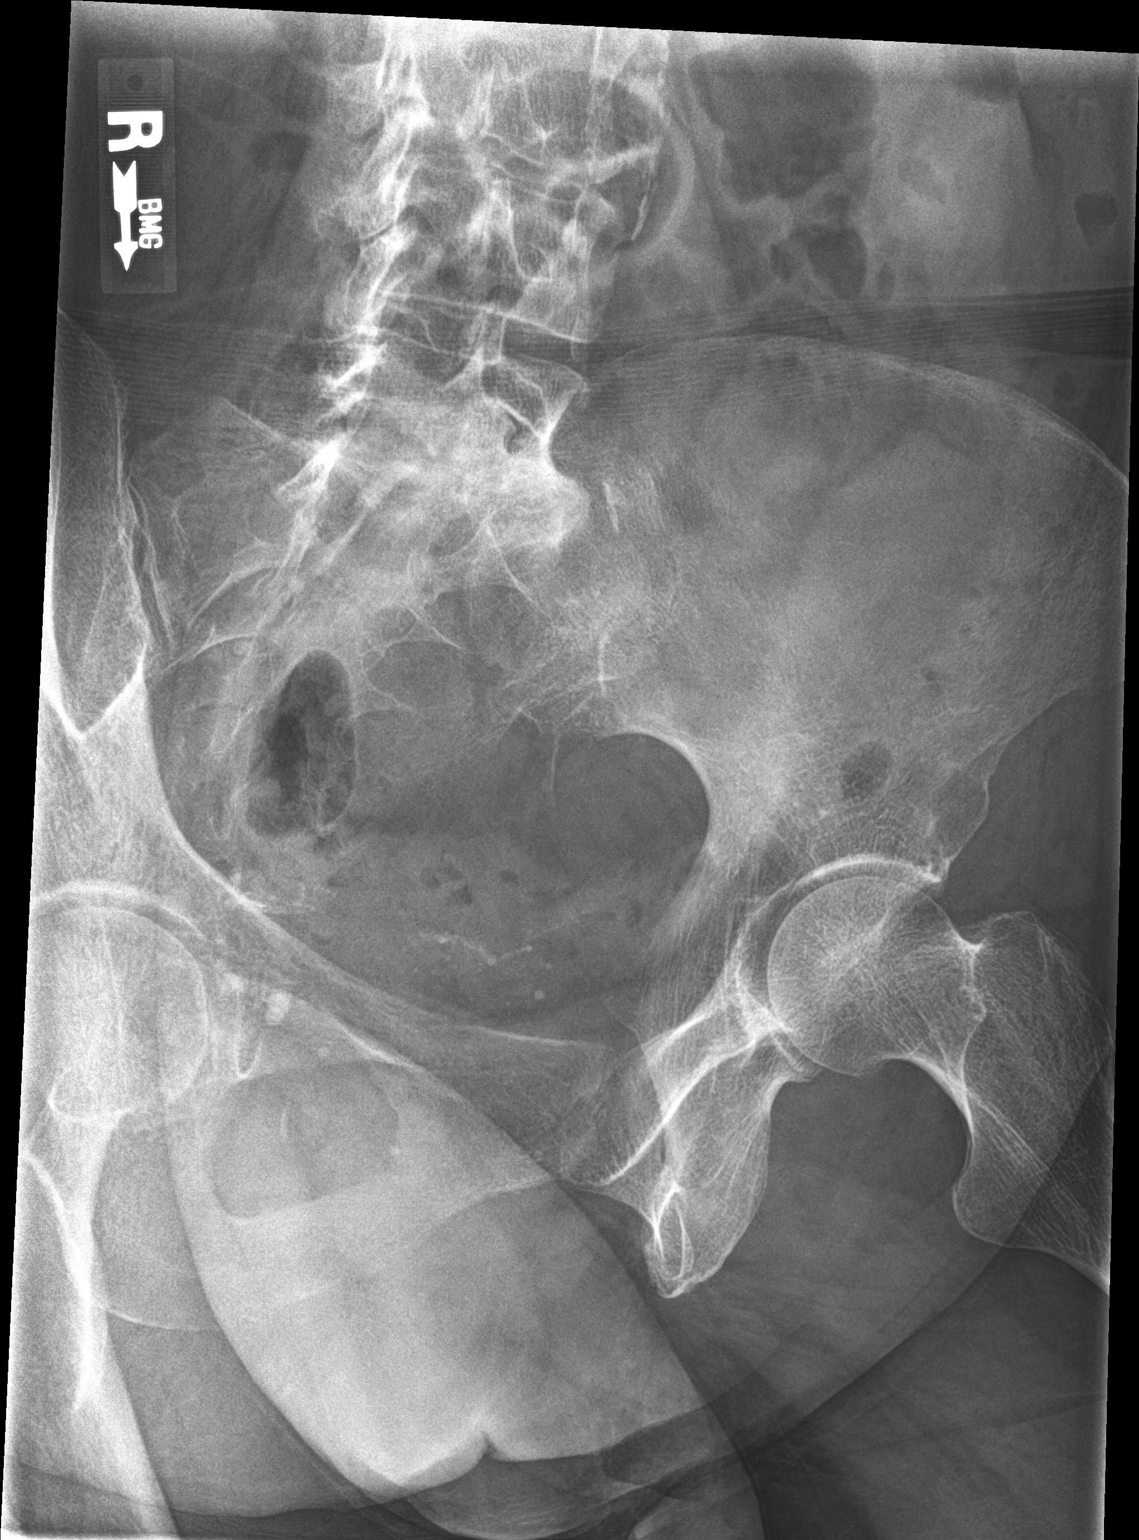

[3 of 3 positions shown; findings below may reference images not displayed]

FINDINGS: Degenerative changes lumbar spine, SI joints, and both hips. Diffuse
osteopenia. No evidence of fracture dislocation. Pelvic
calcifications consistent phleboliths. Aortoiliac atherosclerotic
vascular calcification.
IMPRESSION: 1. Degenerative changes lumbar spine, SI joints, both hips. Diffuse
osteopenia. No acute bony abnormality.

2.  Aortoiliac atherosclerotic vascular calcification.

## 2021-02-13 LAB — COLOGUARD: COLOGUARD: NEGATIVE

## 2021-02-13 LAB — EXTERNAL GENERIC LAB PROCEDURE: COLOGUARD: NEGATIVE

## 2024-02-14 NOTE — Progress Notes (Addendum)
 Cardiology Office Note:   Date:  02/17/2024  ID:  Alice Graham, DOB Sep 12, 1955, MRN [id number] PCP:  Alice Luke POUR, MD  [facility] Providers Cardiologist:  Alice Haws, MD Referring MD: Alice Leeroy Jenkins [name], MD  Chief Complaint/Reason for Referral: Palpitations ASSESSMENT:    1. Palpitations   2. Diastolic dysfunction   3. Essential hypertension   4. Hyperlipidemia LDL goal <70   5. Aortic atherosclerosis   6. Carotid artery disease, unspecified laterality, unspecified type   7. Rheumatoid arthritis, involving unspecified site, unspecified whether rheumatoid factor present (HCC)   8. Prediabetes   9. BMI 31.0-31.9,adult     PLAN:   In order of problems listed above: Palpitations: Better off Enberel.   Monitor for now. Diastolic dysfunction: Continue Toprol  50 mg, start losartan  25, check labs in 1 week Hypertension: Continue Toprol  50 mg, start losartan  25 Hyperlipidemia: Continue atorvastatin  20 mg.  Check lipid panel, LFTs, LP(a) next week Aortic atherosclerosis: Continue aspirin 81 mg, atorvastatin  20 mg Carotid artery disease: Ultrasound in 2018 demonstrated moderate disease; obtain carotid ultrasound.  Continue aspirin 81 mg, atorvastatin  20 mg Rheumatoid arthritis: Continue methotrexate  15 mg weekly Prediabetes: Check hemoglobin A1c next week Elevated BMI: Given hemoglobin A1c consistent with prediabetes in 2018, will check hemoglobin A1c next week.            Dispo:  Return in about 6 months (around 08/16/2024).       I spent 42 minutes reviewing all clinical data during and prior to this visit including all relevant imaging studies, laboratories, clinical information from other health systems and prior notes from both Cardiology and other specialties, interviewing the patient, conducting a complete physical examination, and coordinating care in order to formulate a comprehensive and personalized evaluation and treatment plan.   History of Present Illness:     FOCUSED PROBLEM LIST:   Diastolic dysfunction Mild LVH, G1 DD, no significant valve issues, EF 60 to 65% TTE 2018 Mild diastolic dysfunction, EF 55-60% TTE 2023 [facility] Hypertension Hyperlipidemia Aortic atherosclerosis Lumbar spine x-ray 2018 Carotid disease Moderate stenosis ultrasound 2018 Palpitations Rare PACs, no atrial fibrillation monitor 2018 NSVT, no AF, occasional PVCs, rare PACs 2023 Low risk MIBI 2023 Rheumatoid arthritis Prediabetes Hemoglobin A1c 6.1 2018 BMI 31  November 2025:  Patient consents to use of AI scribe. The patient is a 68 year old female with the above listed medical problems referred for recommendations regarding possible bradycardia and palpitations.  The patient was last seen in the cardiology division in 2018.  She was referred for murmur.  Monitor and echocardiogram are reassuring.  She has been experiencing palpitations and irregular heartbeats, for which she was prescribed metoprolol . While on Enbrel for five years, her irregular heartbeat worsened, with her heart rate dropping to 40-42 bpm, causing significant fatigue and difficulty walking. Extensive cardiac evaluation, including EKG, echocardiogram, nuclear studies, and a two-week heart monitor, was performed.  In response to her symptoms, she discontinued Enbrel, and her heart rate improved after starting prednisone . She was later started on leflunomide, which increased her heart rate to 118 bpm; she subsequently stopped the medication. Currently, she is on 5 mg of prednisone  daily, which has improved her ability to walk and reduced shortness of breath, although she still experiences some pain.  She reports a history of skipped heartbeats since her early 90s, exacerbated by large meals, but notes significant improvement since discontinuing Enbrel. She continues to experience occasional palpitations, but they are less severe than before.     Current  Medications: Current Meds  Medication Sig    Acetaminophen  (TYLENOL  EXTRA STRENGTH PO) Take by mouth.   ALPRAZolam  (XANAX ) 0.5 MG tablet Take 1 tablet (0.5 mg total) by mouth at bedtime as needed for sleep. **NEED VISIT FOR REFILLS**   aspirin 81 MG tablet Take 81 mg by mouth daily.   atorvastatin  (LIPITOR) 40 MG tablet Take 40 mg by mouth daily.   celecoxib  (CELEBREX ) 100 MG capsule TAKE 1 CAPSULE(100 MG) BY MOUTH TWICE DAILY AS NEEDED FOR MILD PAIN   Cholecalciferol (D-3-5) 125 MCG (5000 UT) capsule Take by mouth.   cyclobenzaprine  (FLEXERIL ) 5 MG tablet Take 5 mg by mouth at bedtime as needed.   Docusate Calcium  (STOOL SOFTENER PO) Take by mouth.   fluticasone  (FLONASE ) 50 MCG/ACT nasal spray Place 2 sprays into both nostrils daily.   folic acid  (FOLVITE ) 1 MG tablet Take 1 tablet (1 mg total) by mouth daily.   losartan  (COZAAR ) 25 MG tablet Take 1 tablet (25 mg total) by mouth daily.   methotrexate  (RHEUMATREX) 2.5 MG tablet Take 6 tablets (15 mg total) by mouth once a week. Caution:Chemotherapy. Protect from light.   metoprolol  succinate (TOPROL -XL) 50 MG 24 hr tablet Take 1 tablet (50 mg total) by mouth daily.   predniSONE  (DELTASONE ) 5 MG tablet Take 2.5-5 mg by mouth daily as needed.   PROAIR  HFA 108 (90 Base) MCG/ACT inhaler INHALE 2 PUFFS INTO THE LUNGS EVERY 6 HOURS AS NEEDED FOR WHEEZING OR SHORTNESS OF BREATH   triamcinolone  cream (KENALOG ) 0.1 % Apply 1 application topically 2 (two) times daily.   valACYclovir (VALTREX) 500 MG tablet Take 500 mg by mouth.   Current Facility-Administered Medications for the 02/17/24 encounter (Office Visit) with Alice Rochefort K, MD  Medication   cyanocobalamin  ((VITAMIN B-12)) injection 1,000 mcg     Review of Systems:   Please see the history of present illness.    All other systems reviewed and are negative.     EKGs/Labs/Other Test Reviewed:   EKG:  EKG Interpretation Date/Time:  Monday February 17 2024 15:40:28 EST Ventricular Rate:  70 PR Interval:  144 QRS  Duration:  94 QT Interval:  394 QTC Calculation: 425 R Axis:   77  Text Interpretation: Normal sinus rhythm Nonspecific ST abnormality No previous ECGs available Confirmed by Alice Graham ([id number]) on 02/17/2024 3:50:17 PM        CARDIAC STUDIES: Refer to CV Procedures and Imaging Tabs   Risk Assessment/Calculations:          Physical Exam:   VS:  BP (!) 150/90   Pulse 74   Ht 5' 3 (1.6 m)   Wt 169 lb 3.2 oz (76.7 kg)   SpO2 95%   BMI 29.97 kg/m    HYPERTENSION CONTROL Vitals:   02/17/24 1537 02/17/24 1601  BP: (!) 146/78 (!) 150/90    The patient's blood pressure is elevated above target today.  In order to address the patient's elevated BP: A new medication was prescribed today.      Wt Readings from Last 3 Encounters:  02/17/24 169 lb 3.2 oz (76.7 kg)  01/01/18 168 lb (76.2 kg)  09/05/17 166 lb (75.3 kg)      GENERAL:  No apparent distress, AOx3 HEENT:  No carotid bruits, +2 carotid impulses, no scleral icterus CAR: RRR no murmurs, gallops, rubs, or thrills RES:  Clear to auscultation bilaterally ABD:  Soft, nontender, nondistended, positive bowel sounds x 4 VASC:  +2 radial pulses, +2 carotid pulses NEURO:  CN 2-12 grossly intact; motor and sensory grossly intact PSYCH:  No active depression or anxiety EXT:  No edema, ecchymosis, or cyanosis  Signed, Trevan Messman Graham Hazley Dezeeuw, MD  02/17/2024 4:49 PM    [facility] [address] [place], [place], [place]  [address] Phone: [phone]; Fax: [phone]   Note:  This document was prepared using Dragon voice recognition software and may include unintentional dictation errors.

## 2024-02-17 ENCOUNTER — Ambulatory Visit: Attending: Internal Medicine | Admitting: Internal Medicine

## 2024-02-17 ENCOUNTER — Encounter: Payer: Self-pay | Admitting: Internal Medicine

## 2024-02-17 VITALS — BP 150/90 | HR 74 | Ht 63.0 in | Wt 169.2 lb

## 2024-02-17 DIAGNOSIS — I700 Atherosclerosis of aorta: Secondary | ICD-10-CM

## 2024-02-17 DIAGNOSIS — I10 Essential (primary) hypertension: Secondary | ICD-10-CM | POA: Diagnosis not present

## 2024-02-17 DIAGNOSIS — Z6831 Body mass index (BMI) 31.0-31.9, adult: Secondary | ICD-10-CM

## 2024-02-17 DIAGNOSIS — E785 Hyperlipidemia, unspecified: Secondary | ICD-10-CM | POA: Diagnosis not present

## 2024-02-17 DIAGNOSIS — R002 Palpitations: Secondary | ICD-10-CM

## 2024-02-17 DIAGNOSIS — M069 Rheumatoid arthritis, unspecified: Secondary | ICD-10-CM

## 2024-02-17 DIAGNOSIS — I5189 Other ill-defined heart diseases: Secondary | ICD-10-CM

## 2024-02-17 DIAGNOSIS — I779 Disorder of arteries and arterioles, unspecified: Secondary | ICD-10-CM

## 2024-02-17 DIAGNOSIS — R7303 Prediabetes: Secondary | ICD-10-CM

## 2024-02-17 MED ORDER — LOSARTAN POTASSIUM 25 MG PO TABS: 25.0000 mg | ORAL_TABLET | Freq: Every day | ORAL | 3 refills | Status: AC

## 2024-02-17 NOTE — Patient Instructions (Signed)
 Medication Instructions:  Start Losartan  25 mg once daily *If you need a refill on your cardiac medications before your next appointment, please call your pharmacy*  Lab Work: Fasting lipid panel, CMP, Lpa, A1c in 1 week at [facility] If you have labs (blood work) drawn today and your tests are completely normal, you will receive your results only by: MyChart Message (if you have MyChart) OR A paper copy in the mail If you have any lab test that is abnormal or we need to change your treatment, we will call you to review the results.  Testing/Procedures: Carotid Doppler  Follow-Up: At [facility], you and your health needs are our priority.  As part of our continuing mission to provide you with exceptional heart care, our providers are all part of one team.  This team includes your primary Cardiologist (physician) and Advanced Practice Providers or APPs (Physician Assistants and Nurse Practitioners) who all work together to provide you with the care you need, when you need it.  Your next appointment:   6 month(s)  Provider:   APP  We recommend signing up for the patient portal called MyChart.  Sign up information is provided on this After Visit Summary.  MyChart is used to connect with patients for Virtual Visits (Telemedicine).  Patients are able to view lab/test results, encounter notes, upcoming appointments, etc.  Non-urgent messages can be sent to your provider as well.   To learn more about what you can do with MyChart, go to [email or web address].

## 2024-02-24 ENCOUNTER — Ambulatory Visit: Payer: Self-pay | Admitting: Internal Medicine

## 2024-02-24 ENCOUNTER — Ambulatory Visit (HOSPITAL_COMMUNITY)
Admission: RE | Admit: 2024-02-24 | Discharge: 2024-02-24 | Disposition: A | Source: Ambulatory Visit | Attending: Internal Medicine

## 2024-02-24 DIAGNOSIS — I779 Disorder of arteries and arterioles, unspecified: Secondary | ICD-10-CM | POA: Diagnosis present

## 2024-02-24 DIAGNOSIS — E785 Hyperlipidemia, unspecified: Secondary | ICD-10-CM

## 2024-02-24 DIAGNOSIS — I6523 Occlusion and stenosis of bilateral carotid arteries: Secondary | ICD-10-CM | POA: Diagnosis present

## 2024-04-06 NOTE — Progress Notes (Signed)
 "  Subjective  Annual Physical  Exam  Alice Graham is a 69 y.o. (DOB November 13, 1955) female.     Patient presents with   Medicare Wellness Visit    Medicare Annual Wellness and Annual Physical Exam, patient is scheduled for Mobile Mammogram on 05/11/2024. Patient received colorectal cancer screening packet in the mail from [facility] Medicare(not Cologuard). Patient states she has a Astronomer through [facility] Maintenance/Screening:  Health Maintenance  Topic Date Due   COPD Spirometry Testing  Never done   Lung Cancer Screening  Never done   RSV Adult and Pregnancy (1 - Risk 50-74 years 1-dose series) Never done   Mammogram  04/03/2023   Influenza Vaccine (1) 11/25/2023   COVID-19 Vaccine (6 - 2025-26 season) 11/25/2023   Colorectal Cancer Screening  02/09/2024   Creatinine Level  04/04/2024   Potassium Level  04/04/2024   Lipid Panel  04/04/2024   Medicare Annual Wellness  04/06/2025   DTaP/Tdap/Td Vaccines (2 - Td or Tdap) 05/31/2027   Zoster Vaccine  Completed   Pneumococcal Vaccine: 50+ Years  Completed   Meningococcal B Vaccine  Aged Out   Hepatitis A Vaccine  Aged Out   Meningococcal Conjugate Vaccine  Aged Out    Immunization History  Administered Date(s) Administered   Influenza Flublok Quadrivalent 07/30/2017, 12/31/2017, 12/08/2018   Influenza Quad 0.50ml single-dose vial(Fluzone) 01/09/2013, 03/24/2014   Influenza vaccine, quadrivalent, adjuvanted(Fluad) 01/11/2021   Influenza virus vaccine, unspecified formulation 01/09/2013, 03/24/2014   Influenza, adjuvanted, trivalent, PF (Fluad) 04/05/2023   Influenza, split virus, trivalent, preservative (Afluria, Fluzone) 03/24/2014   Pfizer COVID-19 Bivalent 12+yr 07/10/2019, 04/08/2021   Pfizer-BioNTech COVID-19 Monovalent Vaccine mRNA 06/19/2019, 07/10/2019, 02/19/2020   Pneumococcal Conjugate 13 (Prevnar) 01/19/2016   Pneumococcal Polysaccharide 05/01/2016   Pneumococcal  Polysaccharide (Pneumovax) 09/01/2021   Pneumococcal UNSPECIFIED 05/01/2016   Tdap 05/30/2017   Zoster Recombinant (Shingrix) 09/05/2017, 11/09/2017   Lifestyle:  Body mass index is 30.11 kg/m. Diet:  general Exercise frequency:  frequently Exercise type:  walking   Problem List[1]  Allergies Patient is allergic to arava [leflunomide], enbrel, ibuprofen, and reclast [zoledronic acid].  Past Medical History Patient  has a past medical history of Essential hypertension (10/13/2014), Hyperlipidemia, Irritable bowel, Pernicious anemia (12/19/2016), and Rheumatoid arthritis (*).  Past Surgical History Patient  has no past surgical history on file.  Social History Patient  reports that she quit smoking about 8 years ago. Her smoking use included cigarettes. She started smoking about 49 years ago. She has a 45 pack-year smoking history. She has been exposed to tobacco smoke. She has never used smokeless tobacco. She reports that she does not currently use alcohol. She reports that she does not use drugs.  Family History Patient's family history includes Cancer in her sister and sister; Diabetes in her sister; Heart disease (age of onset: 53) in her mother; Stroke in her father.  Review of Systems  Pertinent positives: none Otherwise ROS is negative. Objective   BP 122/76 (BP Location: Left Upper Arm, Patient Position: Sitting)   Pulse 75   Temp 97.5 F (36.4 C) (Temporal)   Resp 18   Ht 5' 3 (1.6 m)   Wt 170 lb (77.1 kg)   SpO2 95%   BMI 30.11 kg/m   General: Alert and Oriented.  No acute distress. HEENT: Normocephalic, atraumatic. Sclera noniecteric, conjunctiva clear Neck:  Supple with no lymphadenopathy, masses, or thyromegaly. Lungs:   The lungs are clear to auscultation bilaterally with  no wheezes or crackles Cardio:  RRR, No murmur, rub, or gallops. Abdomen:  Normal bowel sounds. soft and non tender without rebound or guarding,  no masses are noted.  No  hepatosplenomegaly is noted.  Skin:  No rashes or worrisome lesions are noted.  Extremities:  No pretibial edema Neuro: Gait normal.  CN 2-11 are grossly normal.  Psych:  Normal mood and affect    Assessment/Plan    Annual Physical Exam Health maintenance issues including appropriate cancer screening, healthy diet, exercise, and tobacco avoidance/cessation were discussed with the patient.   Routine labs and screening tests ordered.    In addition: Cough  Patient has a longstanding cough without progression.  She has a history of smoking and has deferred lung cancer screening but is willing to complete this year.  She states she does not have dyspnea on exertion or chest pain.  She has been reluctant to treat probable COPD due to cost.  Will trial sending in Spiriva and she is open to following up with pulmonology to discuss PFTs for diagnosis as well as consideration of sleep study for snoring and daytime somnolence  Essential hypertension  Since last seen, cardiology has added losartan .  Patient reports she is doing well.  She has not yet had her creatinine and potassium rechecked so we will do so today  Hyperlipidemia  Patient compliant with statin, reassess lipid panel  Rheumatoid arthritis (*)  No longer on Enbrel, back on methotrexate  and feels like she has fewer side effects on this medication.  Recommended continued close follow-up per rheumatology recommendations  Written patient instructions printed and documented in After Visit [place]  Orders Placed This Encounter  Procedures   CT Chest Lung Screening   CBC And Differential   Comprehensive Metabolic Panel   Lipid Panel   Hemoglobin A1c   Vitamin D 25 Hydroxy   Vitamin B12   Ambulatory referral to Pulmonology    Follow up in about 6 months (around 10/04/2024).   Medications at end of visit: Current Medications[2]  Medicare AWV  Alice Graham is a 69 y.o. female who presents for her subsequent  annual wellness visit for Medicare.  Clinical documentation was reviewed and is accessible via encounter-level attachments.    Any physical exam components or additional concerns beyond the scope of the Annual Wellness Visit may be documented in a separate note within this encounter.  Medicare Required Components     Reviewed and updated this visit by provider: Tobacco  Allergies  Meds  Problems  Med Hx  Surg Hx  Fam Hx       Medicare required assessment: Future risk of substance use disorder / overdose risk of 10% is typical (1.3-20%).    Patient Care Team: Luke MARLA Manns, MD as PCP - General (Family Medicine)  Vitals   Vitals:   04/06/24 1211  BP: 122/76  Patient Position: Sitting  Pulse: 75  Temp: 97.5 F (36.4 C)  TempSrc: Temporal  Resp: 18  Height: 5' 3 (1.6 m)  Weight: 170 lb (77.1 kg)  SpO2: 95%  BMI (Calculated): 30.1  PainSc: 0-No pain    Disposition   1. Medicare annual wellness visit, subsequent (Primary) 2. Annual physical exam 3. History of tobacco use -     CT Chest Lung Screening; Future -     Ambulatory referral to Pulmonology; Future -     CBC And Differential; Future -     Comprehensive Metabolic Panel; Future -  Lipid Panel; Future -     Hemoglobin A1c; Future -     Vitamin D 25 Hydroxy; Future -     Vitamin B12; Future 4. Daytime somnolence -     Ambulatory referral to Pulmonology; Future -     CBC And Differential; Future -     Comprehensive Metabolic Panel; Future -     Lipid Panel; Future -     Hemoglobin A1c; Future -     Vitamin D 25 Hydroxy; Future -     Vitamin B12; Future 5. Cough, unspecified type Assessment & Plan:  Patient has a longstanding cough without progression.  She has a history of smoking and has deferred lung cancer screening but is willing to complete this year.  She states she does not have dyspnea on exertion or chest pain.  She has been reluctant to treat probable COPD due to cost.  Will trial sending in  Spiriva and she is open to following up with pulmonology to discuss PFTs for diagnosis as well as consideration of sleep study for snoring and daytime somnolence Orders: -     Ambulatory referral to Pulmonology; Future -     tiotropium Bromide (SPIRIVA HANDIHALER) 18 mcg CAPS inhalation capsule; Place one capsule (18 mcg dose) into inhaler and inhale daily., Starting Mon 04/06/2024, Normal 6. Essential hypertension Assessment & Plan:  Since last seen, cardiology has added losartan .  Patient reports she is doing well.  She has not yet had her creatinine and potassium rechecked so we will do so today Orders: -     CBC And Differential; Future -     Comprehensive Metabolic Panel; Future -     Lipid Panel; Future -     Hemoglobin A1c; Future -     Vitamin D 25 Hydroxy; Future -     Vitamin B12; Future 7. Hyperlipidemia, unspecified hyperlipidemia type Assessment & Plan:  Patient compliant with statin, reassess lipid panel Orders: -     CBC And Differential; Future -     Comprehensive Metabolic Panel; Future -     Lipid Panel; Future -     Hemoglobin A1c; Future -     Vitamin D 25 Hydroxy; Future -     Vitamin B12; Future 8. Vitamin B12 deficiency -     CBC And Differential; Future -     Comprehensive Metabolic Panel; Future -     Lipid Panel; Future -     Hemoglobin A1c; Future -     Vitamin D 25 Hydroxy; Future -     Vitamin B12; Future 9. History of prediabetes -     Hemoglobin A1c; Future 10. Rheumatoid arthritis, involving unspecified site, unspecified whether rheumatoid factor present (*) Assessment & Plan:  No longer on Enbrel, back on methotrexate  and feels like she has fewer side effects on this medication.  Recommended continued close follow-up per rheumatology recommendations 11. Chronic obstructive pulmonary disease, unspecified COPD type (*) -     tiotropium Bromide (SPIRIVA HANDIHALER) 18 mcg CAPS inhalation capsule; Place one capsule (18 mcg dose) into inhaler and inhale  daily., Starting Mon 04/06/2024, Normal   Follow up in about 6 months (around 10/04/2024).   Health maintenance issues were discussed with the patient.  A written plan was provided to the patient in the form of patient instructions in the After Visit Summary document.         [1] Patient Active Problem List Diagnosis   Osteoporosis   Essential hypertension  Hyperlipidemia   Rheumatoid arthritis (*)   Vitamin B12 deficiency   Insomnia   Neck pain   Dry eyes   History of tobacco use   Dyspnea   Immunodeficiency due to drugs (*)   Palpitations   Daytime somnolence   Skin lesion   Cough  [2]  Current Outpatient Medications:    acetaminophen  (TYLENOL ) 500 mg tablet, Take one tablet (500 mg dose) by mouth every 6 (six) hours as needed for Pain., Disp: , Rfl:    albuterol  sulfate HFA (PROVENTIL ,VENTOLIN ,PROAIR ) 108 (90 Base) MCG/ACT inhaler, Inhale two puffs into the lungs every 6 (six) hours as needed for Wheezing., Disp: 1 g, Rfl: 2   aspirin (ASPIRIN EC) EC tablet, Take one tablet (81 mg dose) by mouth daily., Disp: , Rfl:    atorvastatin  (LIPITOR) 40 mg tablet, Take one tablet (40 mg dose) by mouth daily., Disp: 90 tablet, Rfl: 3   Cholecalciferol (VITAMIN D) 125 MCG (5000 UT) CAPS, Take by mouth. Once a day, Disp: , Rfl:    Cyanocobalamin  (VITAMIN B-12 PO), Take 2,500 mcg by mouth daily. , Disp: , Rfl:    cyclobenzaprine  (FLEXERIL ) 10 mg tablet, Take one tablet (10 mg dose) by mouth daily as needed for Muscle spasms., Disp: 90 tablet, Rfl: 0   docusate sodium (COLACE,DOK,DOCQLACE) capsule, Take one capsule (100 mg dose) by mouth daily as needed for Constipation., Disp: , Rfl:    fluticasone  propionate (FLONASE ) 50 mcg/actuation nasal spray, USE 1 SPRAY IN EACH NOSTRIL EVERY DAY, Disp: 32 g, Rfl: 3   folic acid  1 mg tablet, Take one tablet (1 mg dose) by mouth daily., Disp: , Rfl:    losartan  potassium (COZAAR ) 25 mg tablet, Take one tablet (25 mg  dose) by mouth daily., Disp: , Rfl:    methotrexate  2.5 mg tablet, Take six tablets (15 mg dose) by mouth once a week at 0900., Disp: , Rfl:    metoprolol  succinate (TOPROL -XL) 50 mg 24 hr tablet, TAKE 1 TABLET EVERY DAY, Disp: 90 tablet, Rfl: 3   predniSONE  (DELTASONE ) 5 mg tablet, Take one tablet (5 mg dose) by mouth daily as needed., Disp: , Rfl:    tiotropium Bromide (SPIRIVA HANDIHALER) 18 mcg CAPS inhalation capsule, Place one capsule (18 mcg dose) into inhaler and inhale daily., Disp: 30 capsule, Rfl: 2   valACYclovir (VALTREX) 500 mg tablet, Take one tablet (500 mg dose) by mouth 2 (two) times daily., Disp: 12 tablet, Rfl: 0"

## 2024-04-16 LAB — LIPID PANEL

## 2024-04-17 LAB — COMPREHENSIVE METABOLIC PANEL WITH GFR
ALT: 15 IU/L (ref 0–32)
AST: 21 IU/L (ref 0–40)
Albumin: 4.5 g/dL (ref 3.9–4.9)
Alkaline Phosphatase: 80 IU/L (ref 49–135)
BUN/Creatinine Ratio: 23 (ref 12–28)
BUN: 11 mg/dL (ref 8–27)
Bilirubin Total: 0.2 mg/dL (ref 0.0–1.2)
CO2: 20 mmol/L (ref 20–29)
Calcium: 9.2 mg/dL (ref 8.7–10.3)
Chloride: 103 mmol/L (ref 96–106)
Creatinine, Ser: 0.48 mg/dL — AB (ref 0.57–1.00)
Globulin, Total: 2.5 g/dL (ref 1.5–4.5)
Glucose: 123 mg/dL — AB (ref 70–99)
Potassium: 4.9 mmol/L (ref 3.5–5.2)
Sodium: 138 mmol/L (ref 134–144)
Total Protein: 7 g/dL (ref 6.0–8.5)
eGFR: 102 mL/min/1.73

## 2024-04-17 LAB — HEMOGLOBIN A1C
Est. average glucose Bld gHb Est-mCnc: 131 mg/dL
Hgb A1c MFr Bld: 6.2 % — ABNORMAL HIGH (ref 4.8–5.6)

## 2024-04-17 LAB — LIPID PANEL
Cholesterol, Total: 161 mg/dL (ref 100–199)
HDL: 43 mg/dL
LDL CALC COMMENT:: 3.7 ratio (ref 0.0–4.4)
LDL Chol Calc (NIH): 82 mg/dL (ref 0–99)
Triglycerides: 215 mg/dL — AB (ref 0–149)
VLDL Cholesterol Cal: 36 mg/dL (ref 5–40)

## 2024-04-17 LAB — LIPOPROTEIN A (LPA): Lipoprotein (a): 71 nmol/L

## 2024-04-24 MED ORDER — ATORVASTATIN CALCIUM 80 MG PO TABS: 80.0000 mg | ORAL_TABLET | Freq: Every day | ORAL | 3 refills | Status: AC

## 2024-05-18 ENCOUNTER — Ambulatory Visit: Admitting: Primary Care
